# Patient Record
Sex: Male | Born: 1970 | Race: White | Hispanic: No | Smoking: Never smoker
Health system: Southern US, Community
[De-identification: ages and names within clinical notes are randomized; demographics above are authoritative.]

## PROBLEM LIST (undated history)

## (undated) DIAGNOSIS — M2550 Pain in unspecified joint: Secondary | ICD-10-CM

## (undated) DIAGNOSIS — E291 Testicular hypofunction: Secondary | ICD-10-CM

## (undated) DIAGNOSIS — N529 Male erectile dysfunction, unspecified: Secondary | ICD-10-CM

## (undated) DIAGNOSIS — E669 Obesity, unspecified: Secondary | ICD-10-CM

## (undated) DIAGNOSIS — R7989 Other specified abnormal findings of blood chemistry: Secondary | ICD-10-CM

## (undated) DIAGNOSIS — Z77098 Contact with and (suspected) exposure to other hazardous, chiefly nonmedicinal, chemicals: Secondary | ICD-10-CM

## (undated) DIAGNOSIS — Z973 Presence of spectacles and contact lenses: Secondary | ICD-10-CM

## (undated) DIAGNOSIS — Z77128 Contact with and (suspected) exposure to other hazards in the physical environment: Secondary | ICD-10-CM

## (undated) DIAGNOSIS — K219 Gastro-esophageal reflux disease without esophagitis: Secondary | ICD-10-CM

## (undated) HISTORY — DX: Contact with and (suspected) exposure to other hazards in the physical environment: Z77.128

## (undated) HISTORY — DX: Presence of spectacles and contact lenses: Z97.3

## (undated) HISTORY — DX: Male erectile dysfunction, unspecified: N52.9

## (undated) HISTORY — DX: Obesity, unspecified: E66.9

## (undated) HISTORY — DX: Pain in unspecified joint: M25.50

## (undated) HISTORY — DX: Testicular hypofunction: E29.1

## (undated) HISTORY — DX: Contact with and (suspected) exposure to other hazardous, chiefly nonmedicinal, chemicals: Z77.098

## (undated) HISTORY — PX: ESOPHAGOGASTRODUODENOSCOPY: SHX1529

## (undated) HISTORY — DX: Gastro-esophageal reflux disease without esophagitis: K21.9

## (undated) HISTORY — DX: Other specified abnormal findings of blood chemistry: R79.89

---

## 1991-11-19 HISTORY — PX: NASAL SEPTUM SURGERY: SHX37

## 2008-02-01 ENCOUNTER — Emergency Department (HOSPITAL_COMMUNITY): Admission: EM | Admit: 2008-02-01 | Discharge: 2008-02-01 | Payer: Self-pay | Admitting: Emergency Medicine

## 2008-08-07 ENCOUNTER — Emergency Department (HOSPITAL_COMMUNITY): Admission: EM | Admit: 2008-08-07 | Discharge: 2008-08-07 | Payer: Self-pay | Admitting: Emergency Medicine

## 2008-08-14 ENCOUNTER — Encounter: Admission: RE | Admit: 2008-08-14 | Discharge: 2008-08-14 | Payer: Self-pay | Admitting: Specialist

## 2010-12-12 ENCOUNTER — Emergency Department (HOSPITAL_COMMUNITY)
Admission: EM | Admit: 2010-12-12 | Discharge: 2010-12-12 | Payer: Self-pay | Source: Home / Self Care | Admitting: Emergency Medicine

## 2011-05-18 ENCOUNTER — Emergency Department (HOSPITAL_COMMUNITY)
Admission: EM | Admit: 2011-05-18 | Discharge: 2011-05-18 | Disposition: A | Discharge status: Home / Self Care | Payer: Self-pay | Attending: Emergency Medicine | Admitting: Emergency Medicine

## 2011-05-18 DIAGNOSIS — H538 Other visual disturbances: Secondary | ICD-10-CM | POA: Insufficient documentation

## 2011-05-18 DIAGNOSIS — R002 Palpitations: Secondary | ICD-10-CM | POA: Insufficient documentation

## 2011-05-18 DIAGNOSIS — M542 Cervicalgia: Secondary | ICD-10-CM | POA: Insufficient documentation

## 2011-05-21 ENCOUNTER — Ambulatory Visit (HOSPITAL_COMMUNITY)
Admission: RE | Admit: 2011-05-21 | Discharge: 2011-05-21 | Disposition: A | Discharge status: Home / Self Care | Payer: Self-pay | Source: Ambulatory Visit | Attending: Emergency Medicine | Admitting: Emergency Medicine

## 2011-05-21 DIAGNOSIS — R0989 Other specified symptoms and signs involving the circulatory and respiratory systems: Secondary | ICD-10-CM

## 2011-05-21 DIAGNOSIS — M542 Cervicalgia: Secondary | ICD-10-CM | POA: Insufficient documentation

## 2013-08-28 ENCOUNTER — Emergency Department (HOSPITAL_COMMUNITY)
Admission: EM | Admit: 2013-08-28 | Discharge: 2013-08-28 | Disposition: A | Discharge status: Home / Self Care | Payer: No Typology Code available for payment source | Source: Home / Self Care | Attending: Family Medicine | Admitting: Family Medicine

## 2013-08-28 ENCOUNTER — Encounter (HOSPITAL_COMMUNITY): Payer: Self-pay | Admitting: Emergency Medicine

## 2013-08-28 DIAGNOSIS — G243 Spasmodic torticollis: Secondary | ICD-10-CM

## 2013-08-28 MED ORDER — KETOROLAC TROMETHAMINE 30 MG/ML IJ SOLN
INTRAMUSCULAR | Status: AC
  Filled 2013-08-28: qty 1

## 2013-08-28 MED ORDER — KETOROLAC TROMETHAMINE 10 MG PO TABS: 10.0000 mg | ORAL_TABLET | Freq: Four times a day (QID) | ORAL | Status: DC | PRN

## 2013-08-28 MED ORDER — KETOROLAC TROMETHAMINE 30 MG/ML IJ SOLN: 30.0000 mg | Freq: Once | INTRAMUSCULAR | Status: DC

## 2013-08-28 NOTE — ED Notes (Signed)
Pt c/o neck pain on right side/shoulder onset 5 days  Denies: Inj/trauma, strenuous activity Saw PCP yest and was given flexeril 5 mg w/no relief Pain is constant and increases w/activity... Unable to raise right arm above head Alert w/no signs of acute distress.

## 2013-08-28 NOTE — ED Provider Notes (Signed)
CSN: [id number]     Arrival date & time 08/28/13  1555 History   First MD Initiated Contact with Patient 08/28/13 1629     Chief Complaint  Patient presents with  . Neck Pain   (Consider location/radiation/quality/duration/timing/severity/associated sxs/prior Treatment) Patient is a 42 y.o. male presenting with neck pain. The history is provided by the patient.  Neck Pain Pain location:  R side Quality:  Cramping Pain radiates to:  Does not radiate Pain severity:  Moderate Onset quality:  Unable to specify Duration:  5 days Timing:  Constant Progression:  Unchanged Chronicity:  New (seen by pcp yest, given bid flexeril , without relief.) Context comment:  NKI Associated symptoms: no bladder incontinence, no bowel incontinence, no numbness, no paresis, no tingling and no weakness   Risk factors: no recent head injury and no recurrent falls     History reviewed. No pertinent past medical history. Past Surgical History  Procedure Laterality Date  . Nose surgery     No family history on file. History  Substance Use Topics  . Smoking status: Never Smoker   . Smokeless tobacco: Not on file  . Alcohol Use: Yes    Review of Systems  Gastrointestinal: Negative for bowel incontinence.  Genitourinary: Negative for bladder incontinence.  Musculoskeletal: Positive for neck pain.  Neurological: Negative for tingling, weakness and numbness.    Allergies  Review of patient's allergies indicates no known allergies.  Home Medications   Current Outpatient Rx  Name  Route  Sig  Dispense  Refill  . cyclobenzaprine (FLEXERIL) 5 MG tablet   Oral   Take 5 mg by mouth 3 (three) times daily as needed for muscle spasms.         Marland Kitchen ketorolac (TORADOL) 10 MG tablet   Oral   Take 1 tablet (10 mg total) by mouth every 6 (six) hours as needed for pain.   20 tablet   0    BP 127/85  Pulse 78  Temp(Src) 97.1 F (36.2 C) (Oral)  Resp 18  SpO2 96% Physical Exam  Nursing note and  vitals reviewed. Constitutional: He is oriented to person, place, and time. He appears well-developed and well-nourished. No distress.  HENT:  Head: Normocephalic.  Right Ear: External ear normal.  Left Ear: External ear normal.  Mouth/Throat: Oropharynx is clear and moist.  Neck: Trachea normal. Muscular tenderness present. No spinous process tenderness present. No rigidity. Decreased range of motion present. No Brudzinski's sign and no Kernig's sign noted.  Cardiovascular: Regular rhythm.   Pulmonary/Chest: Effort normal and breath sounds normal.  Musculoskeletal: He exhibits tenderness.  Lymphadenopathy:    He has no cervical adenopathy.  Neurological: He is alert and oriented to person, place, and time. No cranial nerve deficit.  Skin: Skin is warm and dry.    ED Course  Procedures (including critical care time) Labs Review Labs Reviewed - No data to display Imaging Review No results found.    MDM      Linna Hoff, MD 08/28/13 [phone]

## 2015-01-19 ENCOUNTER — Ambulatory Visit (INDEPENDENT_AMBULATORY_CARE_PROVIDER_SITE_OTHER): Payer: BLUE CROSS/BLUE SHIELD | Admitting: Medical

## 2015-01-19 ENCOUNTER — Encounter: Payer: Self-pay | Admitting: Medical

## 2015-01-19 ENCOUNTER — Ambulatory Visit
Admission: RE | Admit: 2015-01-19 | Discharge: 2015-01-19 | Disposition: A | Discharge status: Home / Self Care | Payer: BLUE CROSS/BLUE SHIELD | Source: Ambulatory Visit | Attending: Medical | Admitting: Medical

## 2015-01-19 VITALS — BP 130/84 | HR 80 | Temp 98.0°F | Resp 16 | Ht >= 80 in | Wt 326.0 lb

## 2015-01-19 DIAGNOSIS — E669 Obesity, unspecified: Secondary | ICD-10-CM

## 2015-01-19 DIAGNOSIS — R0789 Other chest pain: Secondary | ICD-10-CM

## 2015-01-19 DIAGNOSIS — R059 Cough, unspecified: Secondary | ICD-10-CM

## 2015-01-19 DIAGNOSIS — Z8719 Personal history of other diseases of the digestive system: Secondary | ICD-10-CM

## 2015-01-19 DIAGNOSIS — R5383 Other fatigue: Secondary | ICD-10-CM

## 2015-01-19 DIAGNOSIS — R0600 Dyspnea, unspecified: Secondary | ICD-10-CM | POA: Diagnosis not present

## 2015-01-19 DIAGNOSIS — Z9189 Other specified personal risk factors, not elsewhere classified: Secondary | ICD-10-CM | POA: Diagnosis not present

## 2015-01-19 DIAGNOSIS — Z77128 Contact with and (suspected) exposure to other hazards in the physical environment: Secondary | ICD-10-CM

## 2015-01-19 DIAGNOSIS — R05 Cough: Secondary | ICD-10-CM

## 2015-01-19 LAB — TSH: TSH: 1.689 u[IU]/mL (ref 0.350–4.500)

## 2015-01-19 LAB — CBC WITH DIFFERENTIAL/PLATELET
Basophils Absolute: 0 10*3/uL (ref 0.0–0.1)
Basophils Relative: 0 % (ref 0–1)
Eosinophils Absolute: 0.1 10*3/uL (ref 0.0–0.7)
Eosinophils Relative: 1 % (ref 0–5)
HCT: 43.6 % (ref 39.0–52.0)
Hemoglobin: 14.9 g/dL (ref 13.0–17.0)
LYMPHS ABS: 1.8 10*3/uL (ref 0.7–4.0)
LYMPHS PCT: 26 % (ref 12–46)
MCH: 28.3 pg (ref 26.0–34.0)
MCHC: 34.2 g/dL (ref 30.0–36.0)
MCV: 82.7 fL (ref 78.0–100.0)
MONOS PCT: 10 % (ref 3–12)
MPV: 9.9 fL (ref 8.6–12.4)
Monocytes Absolute: 0.7 10*3/uL (ref 0.1–1.0)
NEUTROS PCT: 63 % (ref 43–77)
Neutro Abs: 4.3 10*3/uL (ref 1.7–7.7)
PLATELETS: 261 10*3/uL (ref 150–400)
RBC: 5.27 MIL/uL (ref 4.22–5.81)
RDW: 14.4 % (ref 11.5–15.5)
WBC: 6.9 10*3/uL (ref 4.0–10.5)

## 2015-01-19 LAB — COMPREHENSIVE METABOLIC PANEL
ALBUMIN: 4.2 g/dL (ref 3.5–5.2)
ALK PHOS: 56 U/L (ref 39–117)
ALT: 45 U/L (ref 0–53)
AST: 25 U/L (ref 0–37)
BILIRUBIN TOTAL: 0.6 mg/dL (ref 0.2–1.2)
BUN: 20 mg/dL (ref 6–23)
CO2: 28 mEq/L (ref 19–32)
Calcium: 9.7 mg/dL (ref 8.4–10.5)
Chloride: 102 mEq/L (ref 96–112)
Creat: 0.97 mg/dL (ref 0.50–1.35)
Glucose, Bld: 99 mg/dL (ref 70–99)
POTASSIUM: 3.9 meq/L (ref 3.5–5.3)
SODIUM: 140 meq/L (ref 135–145)
Total Protein: 6.8 g/dL (ref 6.0–8.3)

## 2015-01-19 MED ORDER — MUPIROCIN 2 % EX OINT: 1.0000 "application " | TOPICAL_OINTMENT | Freq: Three times a day (TID) | CUTANEOUS | Status: DC
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
## 2015-01-23 ENCOUNTER — Telehealth: Payer: Self-pay | Admitting: Family Medicine

## 2015-01-23 NOTE — Telephone Encounter (Signed)
Patient called and said that the mupirocin was burning the inside of his nose. He thought that he was using it wrong because the box says external use. Can you prescribe him something else to use? Please, advie

## 2015-01-23 NOTE — Telephone Encounter (Signed)
I apologize if the label was wrong.  This was for the skin lesion on the left side of his face, not the nose.

## 2015-01-23 NOTE — Telephone Encounter (Signed)
Patient is aware of Alex CoveyShane Tysinger PA message about the medication

## 2015-02-01 ENCOUNTER — Other Ambulatory Visit: Payer: Self-pay | Admitting: Medical

## 2015-02-01 ENCOUNTER — Encounter: Payer: Self-pay | Admitting: Medical

## 2015-02-01 ENCOUNTER — Ambulatory Visit (INDEPENDENT_AMBULATORY_CARE_PROVIDER_SITE_OTHER): Payer: BLUE CROSS/BLUE SHIELD | Admitting: Medical

## 2015-02-01 VITALS — BP 100/68 | HR 82 | Temp 98.6°F | Resp 15 | Wt 336.0 lb

## 2015-02-01 DIAGNOSIS — R0600 Dyspnea, unspecified: Secondary | ICD-10-CM

## 2015-02-01 DIAGNOSIS — N528 Other male erectile dysfunction: Secondary | ICD-10-CM

## 2015-02-01 DIAGNOSIS — E669 Obesity, unspecified: Secondary | ICD-10-CM | POA: Diagnosis not present

## 2015-02-01 LAB — LIPID PANEL
CHOL/HDL RATIO: 4.7 ratio
CHOLESTEROL: 147 mg/dL (ref 0–200)
HDL: 31 mg/dL — ABNORMAL LOW (ref >=40)
LDL Cholesterol: 89 mg/dL (ref 0–99)
TRIGLYCERIDES: 137 mg/dL (ref <150)
VLDL: 27 mg/dL (ref 0–40)

## 2015-02-01 MED ORDER — MOMETASONE FUROATE 220 MCG/INH IN AEPB: 1.0000 | INHALATION_SPRAY | Freq: Two times a day (BID) | RESPIRATORY_TRACT | Status: DC

## 2015-02-01 MED ORDER — ALBUTEROL SULFATE HFA 108 (90 BASE) MCG/ACT IN AERS: 2.0000 | INHALATION_SPRAY | Freq: Four times a day (QID) | RESPIRATORY_TRACT | Status: DC | PRN

## 2015-02-01 MED ORDER — AVANAFIL 100 MG PO TABS: 1.0000 | ORAL_TABLET | ORAL | Status: DC | PRN
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
## 2015-02-02 ENCOUNTER — Other Ambulatory Visit: Payer: Self-pay | Admitting: Medical

## 2015-02-02 LAB — TESTOSTERONE: TESTOSTERONE: 221 ng/dL — AB (ref 300–890)

## 2015-02-02 MED ORDER — ALBUTEROL SULFATE 108 (90 BASE) MCG/ACT IN AEPB: 1.0000 | INHALATION_SPRAY | Freq: Four times a day (QID) | RESPIRATORY_TRACT | Status: DC | PRN

## 2015-02-16 ENCOUNTER — Encounter: Payer: Self-pay | Admitting: Medical

## 2015-02-16 ENCOUNTER — Ambulatory Visit (INDEPENDENT_AMBULATORY_CARE_PROVIDER_SITE_OTHER): Payer: BLUE CROSS/BLUE SHIELD | Admitting: Medical

## 2015-02-16 VITALS — BP 118/74 | HR 78 | Temp 98.0°F | Resp 15 | Wt 339.0 lb

## 2015-02-16 DIAGNOSIS — J4530 Mild persistent asthma, uncomplicated: Secondary | ICD-10-CM | POA: Diagnosis not present

## 2015-02-16 DIAGNOSIS — E291 Testicular hypofunction: Secondary | ICD-10-CM | POA: Diagnosis not present

## 2015-02-16 DIAGNOSIS — R5383 Other fatigue: Secondary | ICD-10-CM

## 2015-02-16 DIAGNOSIS — R7989 Other specified abnormal findings of blood chemistry: Secondary | ICD-10-CM

## 2015-02-16 DIAGNOSIS — N529 Male erectile dysfunction, unspecified: Secondary | ICD-10-CM | POA: Diagnosis not present

## 2015-02-16 DIAGNOSIS — R062 Wheezing: Secondary | ICD-10-CM

## 2015-02-16 LAB — FSH/LH
FSH: 5.9 m[IU]/mL (ref 1.4–18.1)
LH: 3.8 m[IU]/mL (ref 1.5–9.3)

## 2015-02-16 LAB — PROLACTIN: Prolactin: 8.8 ng/mL (ref 2.1–17.1)

## 2015-02-16 LAB — TESTOSTERONE: Testosterone: 210 ng/dL — ABNORMAL LOW (ref 300–890)

## 2015-02-16 NOTE — Progress Notes (Signed)
Subjective: Here for follow-up. Since last visit started asmanex with good improvement in dyspnea and wheezing.  Hasn't had to use albuterol  Hasn't tried stendra yet for ED.  Has used friend's daily cialis in the past with good benefit  Still notes fatigue, erectile dysfunction, low sex drive.  Here for recheck on low TST from last visit  No other aggravating or relieving factors. No other complaint.  ROS as in subjective   Objective: BP 118/74 mmHg  Pulse 78  Temp(Src) 98 F (36.7 C) (Oral)  Resp 15  Wt 339 lb (153.769 kg)  General appearance: alert, no distress, WD/WN, obese tall white male Heart: RRR, normal S1, S2, no murmurs Lungs: CTA bilaterally, no wheezes, rhonchi, or rales DRE: prostate seems WNL, no obvious nodules   Assessment: Encounter Diagnoses  Name Primary?  Marland Kitchen. Asthma, mild persistent, uncomplicated Yes  . Wheezing   . Low testosterone   . Other fatigue   . Erectile dysfunction, unspecified erectile dysfunction type      Plan: Asthma, wheezing - reviewed spirometry from last visit.   Much improved on Asmanex once daily.  Hasn't had to use albuterol since last visit.  C/t current medication.  Low testosterone - additional labs today for confirmation and further eval. discussed possibly beginning androgel therapy.  Discussed medication use, risks/benefits, precautions and f/u.   Advised weight loss through health lifestyle.  fatigue - additional labs today  ED - hasn't tried stendra yet

## 2015-02-17 ENCOUNTER — Telehealth: Payer: Self-pay | Admitting: Medical

## 2015-02-17 ENCOUNTER — Other Ambulatory Visit: Payer: Self-pay | Admitting: Medical

## 2015-02-17 ENCOUNTER — Other Ambulatory Visit: Payer: Self-pay | Admitting: Family Medicine

## 2015-02-17 DIAGNOSIS — R7989 Other specified abnormal findings of blood chemistry: Secondary | ICD-10-CM

## 2015-02-17 LAB — PSA: PSA: 0.62 ng/mL (ref <=4.00)

## 2015-02-17 MED ORDER — TESTOSTERONE 20.25 MG/1.25GM (1.62%) TD GEL: 2.0000 | TRANSDERMAL | Status: DC

## 2015-02-17 NOTE — Telephone Encounter (Signed)
Initiated P.A. Androgel 

## 2015-02-21 NOTE — Telephone Encounter (Signed)
P.A. Approved Androgel til 11/17/38, pt informed, faxed pharmacy

## 2015-03-20 ENCOUNTER — Other Ambulatory Visit: Payer: BLUE CROSS/BLUE SHIELD

## 2015-03-20 DIAGNOSIS — R7989 Other specified abnormal findings of blood chemistry: Secondary | ICD-10-CM

## 2015-03-21 ENCOUNTER — Other Ambulatory Visit: Payer: Self-pay | Admitting: Family Medicine

## 2015-03-21 LAB — TESTOSTERONE: TESTOSTERONE: 237 ng/dL — AB (ref 300–890)

## 2015-03-21 MED ORDER — TESTOSTERONE 20.25 MG/1.25GM (1.62%) TD GEL: 4.0000 | TRANSDERMAL | Status: DC

## 2015-03-22 ENCOUNTER — Other Ambulatory Visit: Payer: Self-pay

## 2015-04-12 ENCOUNTER — Emergency Department (HOSPITAL_COMMUNITY)
Admission: EM | Admit: 2015-04-12 | Discharge: 2015-04-12 | Disposition: A | Discharge status: Home / Self Care | Payer: Self-pay

## 2015-04-13 ENCOUNTER — Encounter (HOSPITAL_COMMUNITY): Payer: Self-pay | Admitting: Emergency Medicine

## 2015-04-13 ENCOUNTER — Emergency Department (HOSPITAL_COMMUNITY)
Admission: EM | Admit: 2015-04-13 | Discharge: 2015-04-13 | Disposition: A | Discharge status: Home / Self Care | Payer: BLUE CROSS/BLUE SHIELD | Source: Home / Self Care | Attending: Family Medicine | Admitting: Family Medicine

## 2015-04-13 ENCOUNTER — Emergency Department (INDEPENDENT_AMBULATORY_CARE_PROVIDER_SITE_OTHER): Payer: BLUE CROSS/BLUE SHIELD

## 2015-04-13 DIAGNOSIS — J4531 Mild persistent asthma with (acute) exacerbation: Secondary | ICD-10-CM

## 2015-04-13 MED ORDER — IPRATROPIUM-ALBUTEROL 0.5-2.5 (3) MG/3ML IN SOLN
RESPIRATORY_TRACT | Status: AC
  Filled 2015-04-13: qty 3

## 2015-04-13 MED ORDER — IPRATROPIUM-ALBUTEROL 0.5-2.5 (3) MG/3ML IN SOLN
3.0000 mL | Freq: Once | RESPIRATORY_TRACT | Status: AC
  Administered 2015-04-13: 3 mL via RESPIRATORY_TRACT

## 2015-04-13 NOTE — Discharge Instructions (Signed)
Thank you for coming in today. Use the albuterol inhaler as needed Continue the Asmanex twist inhaler Follow-up with your primary care doctor Call or go to the emergency room if you get worse, have trouble breathing, have chest pains, or palpitations.    Asthma, Acute Bronchospasm Acute bronchospasm caused by asthma is also referred to as an asthma attack. Bronchospasm means your air passages become narrowed. The narrowing is caused by inflammation and tightening of the muscles in the air tubes (bronchi) in your lungs. This can make it hard to breathe or cause you to wheeze and cough. CAUSES Possible triggers are:  Animal dander from the skin, hair, or feathers of animals.  Dust mites contained in house dust.  Cockroaches.  Pollen from trees or grass.  Mold.  Cigarette or tobacco smoke.  Air pollutants such as dust, household cleaners, hair sprays, aerosol sprays, paint fumes, strong chemicals, or strong odors.  Cold air or weather changes. Cold air may trigger inflammation. Winds increase molds and pollens in the air.  Strong emotions such as crying or laughing hard.  Stress.  Certain medicines such as aspirin or beta-blockers.  Sulfites in foods and drinks, such as dried fruits and wine.  Infections or inflammatory conditions, such as a flu, cold, or inflammation of the nasal membranes (rhinitis).  Gastroesophageal reflux disease (GERD). GERD is a condition where stomach acid backs up into your esophagus.  Exercise or strenuous activity. SIGNS AND SYMPTOMS   Wheezing.  Excessive coughing, particularly at night.  Chest tightness.  Shortness of breath. DIAGNOSIS  Your health care provider will ask you about your medical history and perform a physical exam. A chest X-ray or blood testing may be performed to look for other causes of your symptoms or other conditions that may have triggered your asthma attack. TREATMENT  Treatment is aimed at reducing inflammation  and opening up the airways in your lungs. Most asthma attacks are treated with inhaled medicines. These include quick relief or rescue medicines (such as bronchodilators) and controller medicines (such as inhaled corticosteroids). These medicines are sometimes given through an inhaler or a nebulizer. Systemic steroid medicine taken by mouth or given through an IV tube also can be used to reduce the inflammation when an attack is moderate or severe. Antibiotic medicines are only used if a bacterial infection is present.  HOME CARE INSTRUCTIONS   Rest.  Drink plenty of liquids. This helps the mucus to remain thin and be easily coughed up. Only use caffeine in moderation and do not use alcohol until you have recovered from your illness.  Do not smoke. Avoid being exposed to secondhand smoke.  You play a critical role in keeping yourself in good health. Avoid exposure to things that cause you to wheeze or to have breathing problems.  Keep your medicines up-to-date and available. Carefully follow your health care provider's treatment plan.  Take your medicine exactly as prescribed.  When pollen or pollution is bad, keep windows closed and use an air conditioner or go to places with air conditioning.  Asthma requires careful medical care. See your health care provider for a follow-up as advised. If you are more than [redacted] weeks pregnant and you were prescribed any new medicines, let your obstetrician know about the visit and how you are doing. Follow up with your health care provider as directed.  After you have recovered from your asthma attack, make an appointment with your outpatient doctor to talk about ways to reduce the likelihood of future  attacks. If you do not have a doctor who manages your asthma, make an appointment with a primary care doctor to discuss your asthma. SEEK IMMEDIATE MEDICAL CARE IF:   You are getting worse.  You have trouble breathing. If severe, call your local emergency  services (911 in the [place]  You develop chest pain or discomfort.  You are vomiting.  You are not able to keep fluids down.  You are coughing up yellow, green, brown, or bloody sputum.  You have a fever and your symptoms suddenly get worse.  You have trouble swallowing. MAKE SURE YOU:   Understand these instructions.  Will watch your condition.  Will get help right away if you are not doing well or get worse. Document Released: 02/19/2007 Document Revised: 11/09/2013 Document Reviewed: 05/12/2013 [facility] Patient Information 2015 [place], [place]. This information is not intended to replace advice given to you by your health care provider. Make sure you discuss any questions you have with your health care provider.

## 2015-04-13 NOTE — ED Provider Notes (Signed)
Alex Atkins is a 44 y.o. male who presents to Urgent Care today for wheezing and cough present for the last few months worsening recently. In the past patient has used Asmanex inhaler which seems to help temporarily while using. No fevers or chills vomiting or diarrhea. 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 No fevers or chills nausea vomiting or diarrhea. No leg swelling.  Patient has but has not been using an albuterol inhaler.   Past Medical History  Diagnosis Date  . Wears glasses   . Obesity   . Joint pain   . 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 GERD (gastroesophageal reflux disease)    Past Surgical History  Procedure Laterality Date  . Nose surgery  1993   History  Substance Use Topics  . Smoking status: Never Smoker   . Smokeless tobacco: Not on file  . Alcohol Use: 1.2 oz/week    1 Glasses of wine, 1 Shots of liquor per week   ROS as above Medications: No current facility-administered medications for this encounter.   Current Outpatient Prescriptions  Medication Sig Dispense Refill  . Albuterol Sulfate (PROAIR RESPICLICK) 108 (90 BASE) MCG/ACT AEPB Inhale 1 Inhaler into the lungs every 6 (six) hours as needed. 1 each 0  . Avanafil (STENDRA) 100 MG TABS Take 1 tablet by mouth as needed. 3 tablet 0  . Fish Oil-Krill Oil (KRILL OIL PLUS PO) Take by mouth.    . Flaxseed, Linseed, (FLAX SEED OIL PO) Take by mouth.    . Misc Natural Products (OSTEO BI-FLEX ADV DOUBLE ST) CAPS Take by mouth.    . mometasone (ASMANEX) 220 MCG/INH inhaler Inhale 1 puff into the lungs 2 (two) times daily. 1 Inhaler 2  . mupirocin ointment (BACTROBAN) 2 % Place 1 application into the nose 3 (three) times daily. 22 g 0  . PROAIR RESPICLICK 108 (90 BASE) MCG/ACT AEPB use as directed 1 each 0  . Testosterone (ANDROGEL) 20.25  MG/1.25GM (1.62%) GEL Place 4 Squirts onto the skin every morning. 1.25 g 1   No Known Allergies   Exam:  BP 131/83 mmHg  Pulse 88  Temp(Src) 97.7 F (36.5 C) (Oral)  Resp 20  SpO2 95% Gen: Well NAD HEENT: EOMI,  MMM Lungs: Normal work of breathing. Coarse breath sounds bilaterally Heart: RRR no MRG Abd: NABS, Soft. Nondistended, Nontender Exts: Brisk capillary refill, warm and well perfused.   Patient was given a 2.5/0.5 mg DuoNeb nebulizer treatment and felt a little better.  No results found for this or any previous visit (from the past 24 hour(s)). Dg Chest 2 View  04/13/2015   CLINICAL DATA:  Cough and wheezing. Shortness of breath for the past 2 months.  EXAM: CHEST  2 VIEW  COMPARISON:  01/19/2015.  FINDINGS: Normal sized heart. Clear lungs. Minimal thoracic spine degenerative changes.  IMPRESSION: No acute abnormality.   Electronically Signed   By: Beckie SaltsSteven  Reid M.D.   On: 04/13/2015 18:12    Assessment and Plan: 44 y.o. male with bronchitis versus asthma exacerbation. Patient declined prednisone. Stating he will use his Asmanex inhaler and albuterol. Follow-up with PCP.  Discussed warning signs or symptoms. Please see discharge instructions. Patient expresses understanding.     Rodolph BongEvan S Corey, MD 04/13/15 [phone]

## 2015-04-13 NOTE — ED Notes (Signed)
C/o  Persistent nonproductive cough for several weeks.  Sob with laying flat.  Wheezing.  Symptoms gradually getting worse.  No relief with inhalers.

## 2015-04-27 ENCOUNTER — Encounter: Payer: Self-pay | Admitting: Family Medicine

## 2015-04-27 ENCOUNTER — Ambulatory Visit (INDEPENDENT_AMBULATORY_CARE_PROVIDER_SITE_OTHER): Payer: BLUE CROSS/BLUE SHIELD | Admitting: Family Medicine

## 2015-04-27 ENCOUNTER — Ambulatory Visit: Payer: BLUE CROSS/BLUE SHIELD | Admitting: Medical

## 2015-04-27 VITALS — BP 118/78 | HR 76 | Wt 333.4 lb

## 2015-04-27 DIAGNOSIS — J45909 Unspecified asthma, uncomplicated: Secondary | ICD-10-CM | POA: Diagnosis not present

## 2015-04-27 DIAGNOSIS — H5711 Ocular pain, right eye: Secondary | ICD-10-CM

## 2015-04-27 DIAGNOSIS — J309 Allergic rhinitis, unspecified: Secondary | ICD-10-CM | POA: Diagnosis not present

## 2015-04-27 NOTE — Progress Notes (Signed)
Chief Complaint  Patient presents with  . inhaler issues    having side effects from asmanex. having pressure in eye. draining alot on the side..sometimes painful   Patient presents with complaint of right eye pain, which he feels is related to use of Asmanex.  He finds that the asmanex helps with his wheezing.  Wheezing recurs if doesn't use it for a couple of weeks, then seems to work well using it just prn.  He uses it every 3-5 days, just when coughing/wheezing.   Every time he takes it, he feels like there is pressure behind the right eye, more than the left.  Pressure is greatest in the morning, but continues periodically throughout the day. Eye symptoms lasts for a couple of days. The following morning he will notice a clear drainage when he presses below and above the eye. He denies crusting, itching, h/o eye trauma.  He has discomfort along the eye when he presses on it.  Last eye exam was 6 months ago. He wasn't on the asmanex at that time, and glaucoma test was normal.  UC last week with cough, improved after Duoneb treatment.  He was not prescribed any antibiotics, just told to continue inhalers. The asmanex inhaler helped his symptoms of wheezing at the time.  He denies any sniffles, sneezes, postnasal drainage, sinus pressure elsewhere. No fevers, chills.  Eyelids feel heavy, like they are sagging.  PMH, PSH, SH reviewed.  Outpatient Encounter Prescriptions as of 04/27/2015  Medication Sig Note  . Avanafil (STENDRA) 100 MG TABS Take 1 tablet by mouth as needed.   . Fish Oil-Krill Oil (KRILL OIL PLUS PO) Take by mouth.   . Misc Natural Products (OSTEO BI-FLEX ADV DOUBLE ST) CAPS Take by mouth.   . mometasone (ASMANEX) 220 MCG/INH inhaler Inhale 1 puff into the lungs 2 (two) times daily.   . Testosterone (ANDROGEL) 20.25 MG/1.25GM (1.62%) GEL Place 4 Squirts onto the skin every morning.   . Albuterol Sulfate (PROAIR RESPICLICK) 108 (90 BASE) MCG/ACT AEPB Inhale 1 Inhaler into the  lungs every 6 (six) hours as needed. (Patient not taking: Reported on 04/27/2015) 04/27/2015: Has never used this yet  . Flaxseed, Linseed, (FLAX SEED OIL PO) Take by mouth.   . [DISCONTINUED] mupirocin ointment (BACTROBAN) 2 % Place 1 application into the nose 3 (three) times daily. (Patient not taking: Reported on 04/27/2015)   . [DISCONTINUED] PROAIR RESPICLICK 108 (90 BASE) MCG/ACT AEPB use as directed    No facility-administered encounter medications on file as of 04/27/2015.   No Known Allergies  ROS: no fever, chills, runny nose, sore throat.  Cough is improved.  Currently without chest pain or shortness of breath.  No nausea, vomiting, diarrhea.  He has a h/o reflux that was much worse when he gained weight. He lost the weight, and no longer has any reflux problems.  Denies vision changes, just eye pressure and pain.  PHYSICAL EXAM: BP 118/78 mmHg  Pulse 76  Wt 333 lb 6.4 oz (151.229 kg) Well developed male, in no distress HEENT: PERRL, EOMI, conjunctiva--only minimally injected in a focal area laterally on the right, otherwise clear.  Fundi was normal without hemorrhage or exudate, but view was limited (miotic pupil, reflection of light). There appears to be slight asymmetry between the eyes, a little generalized swelling on the right (making the opening of the eye appear slightly smaller).  No focal swelling or mass on upper or lower lids, or surrounding the eye.  No erythema. Nasal  mucosa is mild- mod edematous, with clear-white mucus on the left Sinuses are nontender. OP is clear. TM's and EAC's are normal Neck: no lymphadenopathy or mass Heart: regular rate and rhythm Lungs: clear bilaterally. No wheezing. Good air movement. Skin: no visible rash Neuro: alert and oriented.  Cranial nerves intact. Normal gait, strength Psych: normal mood, affect, hygiene and grooming   ASSESSMENT/PLAN:  Eye pain, right - Ddx reviewed with patient. possible sinus/allergy component. f/u with ophtho for  pressure check  Asthma without acute exacerbation - using steroid inhaler inappropriately.  stop asmanex; use albuterol prn.  f/u with Vincenza Hews within 2 weeks  Allergic rhinitis, unspecified allergic rhinitis type - evidence on nasal exam; may have mild allergic conjunctivitis and sinus pressure contributing to ocular symptoms. claritin daily.    Start claritin (loratidine) once daily, as there is evidence of some allergies on your exam. Stop the asmanex.  Use the albuterol as needed for wheezing--this is actually for fast-acting, as needed medication.  The asmanex is a daily preventative medication, but since you aren't able to tolerate taking it that way, stop it for now; use the albuterol if needed for wheezing.  See Vincenza Hews in follow up within the next couple of weeks. Keep track of how often you're needing the albuterol. You may need to be restarted on another type of preventative medication at that visit. Schedule a visit with the eye doctor to rule out glaucoma or other cause of eye pain.  You can also try taking sudafed as needed for the pain, in case it is a sinus-type pain. If you have recurrent cough related possibly to allergies and postnasal drainage (or a cold), use Mucinex (guaifenesin) as needed.

## 2015-04-27 NOTE — Patient Instructions (Signed)
   Start claritin (loratidine) once daily, as there is evidence of some allergies on your exam. Stop the asmanex.  Use the albuterol as needed for wheezing--this is actually for fast-acting, as needed medication.  The asmanex is a daily preventative medication, but since you aren't able to tolerate taking it that way, stop it for now; use the albuterol if needed for wheezing.  See Vincenza Hews in follow up within the next couple of weeks. Keep track of how often you're needing the albuterol. You may need to be restarted on another type of preventative medication at that visit. Schedule a visit with the eye doctor to rule out glaucoma or other cause of eye pain.  You can also try taking sudafed as needed for the pain, in case it is a sinus-type pain. If you have recurrent cough related possibly to allergies and postnasal drainage (or a cold), use Mucinex (guaifenesin) as needed.

## 2015-05-03 ENCOUNTER — Other Ambulatory Visit: Payer: BLUE CROSS/BLUE SHIELD

## 2015-05-03 DIAGNOSIS — E291 Testicular hypofunction: Secondary | ICD-10-CM

## 2015-05-04 LAB — TESTOSTERONE: Testosterone: 234 ng/dL — ABNORMAL LOW (ref 300–890)

## 2015-05-05 ENCOUNTER — Other Ambulatory Visit: Payer: Self-pay | Admitting: Medical

## 2015-05-05 MED ORDER — TESTOSTERONE CYPIONATE 200 MG/ML IM SOLN: 200.0000 mg | INTRAMUSCULAR | Status: DC

## 2015-05-09 ENCOUNTER — Telehealth: Payer: Self-pay | Admitting: Medical

## 2015-05-09 NOTE — Telephone Encounter (Signed)
Pt states was on Androgel & wasn't working so was switched to Testosterone injections.  Advised will initiate P.A.

## 2015-05-10 NOTE — Telephone Encounter (Signed)
Initiated P.A. Testosterone to [facility]

## 2015-05-12 NOTE — Telephone Encounter (Signed)
P.A. Testosterone CYP approved til 11/17/38, faxed pharmacy, left message for pt

## 2015-05-15 ENCOUNTER — Other Ambulatory Visit (INDEPENDENT_AMBULATORY_CARE_PROVIDER_SITE_OTHER): Payer: BLUE CROSS/BLUE SHIELD

## 2015-05-15 DIAGNOSIS — E291 Testicular hypofunction: Secondary | ICD-10-CM

## 2015-05-15 DIAGNOSIS — R7989 Other specified abnormal findings of blood chemistry: Secondary | ICD-10-CM

## 2015-05-15 MED ORDER — TESTOSTERONE CYPIONATE 200 MG/ML IM SOLN
200.0000 mg | Freq: Once | INTRAMUSCULAR | Status: AC
  Administered 2015-05-15: 200 mg via INTRAMUSCULAR

## 2015-05-15 MED ORDER — TESTOSTERONE CYPIONATE 200 MG/ML IM SOLN: 200.0000 mg | INTRAMUSCULAR | Status: DC

## 2015-05-26 ENCOUNTER — Other Ambulatory Visit (INDEPENDENT_AMBULATORY_CARE_PROVIDER_SITE_OTHER): Payer: BLUE CROSS/BLUE SHIELD

## 2015-05-26 DIAGNOSIS — E291 Testicular hypofunction: Secondary | ICD-10-CM

## 2015-05-26 MED ORDER — TESTOSTERONE CYPIONATE 200 MG/ML IM SOLN
200.0000 mg | Freq: Once | INTRAMUSCULAR | Status: AC
  Administered 2015-05-26: 200 mg via INTRAMUSCULAR

## 2015-05-26 MED ORDER — TESTOSTERONE CYPIONATE 200 MG/ML IM SOLN: 200.0000 mg | INTRAMUSCULAR | Status: DC

## 2015-05-29 ENCOUNTER — Other Ambulatory Visit: Payer: BLUE CROSS/BLUE SHIELD

## 2015-06-09 ENCOUNTER — Other Ambulatory Visit (INDEPENDENT_AMBULATORY_CARE_PROVIDER_SITE_OTHER): Payer: BLUE CROSS/BLUE SHIELD

## 2015-06-09 DIAGNOSIS — R7989 Other specified abnormal findings of blood chemistry: Secondary | ICD-10-CM

## 2015-06-09 DIAGNOSIS — E291 Testicular hypofunction: Secondary | ICD-10-CM | POA: Diagnosis not present

## 2015-06-09 MED ORDER — TESTOSTERONE CYPIONATE 200 MG/ML IM SOLN
200.0000 mg | INTRAMUSCULAR | Status: DC
  Administered 2015-06-09: 200 mg via INTRAMUSCULAR

## 2015-06-22 ENCOUNTER — Other Ambulatory Visit (INDEPENDENT_AMBULATORY_CARE_PROVIDER_SITE_OTHER): Payer: BLUE CROSS/BLUE SHIELD

## 2015-06-22 ENCOUNTER — Telehealth: Payer: Self-pay | Admitting: Medical

## 2015-06-22 DIAGNOSIS — E291 Testicular hypofunction: Secondary | ICD-10-CM | POA: Diagnosis not present

## 2015-06-22 NOTE — Telephone Encounter (Signed)
Just scheduled visit and labs, as he is due.

## 2015-06-22 NOTE — Telephone Encounter (Signed)
Pt would like to have his testosterone level and also a full blood panel to check cholesterol, sugar, etc.  Is this ok? if so please put in order

## 2015-06-26 ENCOUNTER — Other Ambulatory Visit: Payer: BLUE CROSS/BLUE SHIELD

## 2015-06-27 ENCOUNTER — Other Ambulatory Visit: Payer: Self-pay | Admitting: Medical

## 2015-06-27 ENCOUNTER — Ambulatory Visit (INDEPENDENT_AMBULATORY_CARE_PROVIDER_SITE_OTHER): Payer: BLUE CROSS/BLUE SHIELD | Admitting: Medical

## 2015-06-27 ENCOUNTER — Encounter: Payer: Self-pay | Admitting: Medical

## 2015-06-27 VITALS — BP 120/86 | HR 76 | Temp 98.2°F | Resp 16 | Ht 79.0 in | Wt 333.2 lb

## 2015-06-27 DIAGNOSIS — R1011 Right upper quadrant pain: Secondary | ICD-10-CM | POA: Diagnosis not present

## 2015-06-27 DIAGNOSIS — E291 Testicular hypofunction: Secondary | ICD-10-CM

## 2015-06-27 DIAGNOSIS — N528 Other male erectile dysfunction: Secondary | ICD-10-CM | POA: Diagnosis not present

## 2015-06-27 DIAGNOSIS — R7989 Other specified abnormal findings of blood chemistry: Secondary | ICD-10-CM

## 2015-06-27 LAB — CBC
HEMATOCRIT: 48.3 % (ref 39.0–52.0)
HEMOGLOBIN: 15.9 g/dL (ref 13.0–17.0)
MCH: 27.9 pg (ref 26.0–34.0)
MCHC: 32.9 g/dL (ref 30.0–36.0)
MCV: 84.7 fL (ref 78.0–100.0)
MPV: 10.1 fL (ref 8.6–12.4)
Platelets: 277 10*3/uL (ref 150–400)
RBC: 5.7 MIL/uL (ref 4.22–5.81)
RDW: 15.5 % (ref 11.5–15.5)
WBC: 5.8 10*3/uL (ref 4.0–10.5)

## 2015-06-27 MED ORDER — TADALAFIL 10 MG PO TABS: ORAL_TABLET | ORAL | Status: DC

## 2015-06-27 NOTE — Progress Notes (Signed)
Subjective: Here for f/u on medications, labs  Since diagnosis of low T and ED several months ago, he initially started on Androgel.  Never really felt improvements on androgel.  We ended up switching to Testosterone injections about 1.5 months ago.  He has been coming q2 weeks for injections and seeing way more improvement in energy, libido, and losing some weight, gaining some muscle, exercising regularly.  Eating more healthy.  He likes this better than the Androgel.    Regarding ED, the stendra didn't really help.  Wants to use cialis prn.   Still has occasionally problems getting or keeping erections.  No other aggravating or relieving factors. No other complaint.  Recently felt some pain in right flank, but thinks it may be muscle pull.  He has been doing weights, exercising, calisthenics.   Wondered about gall bladder, but no nausea or vomiting, no fever, no jaundice.   Only drinks alcohol 1-2 times per week.  Eating healthier overall.   ROS as in subjective   Objective BP 120/86 mmHg  Pulse 76  Temp(Src) 98.2 F (36.8 C) (Oral)  Resp 16  Ht  (2.007 m)  Wt 333 lb 3.2 oz (151.139 kg)  BMI 37.52 kg/m2  Gen: wd, wn, nad Neck: supple, nontender, no mass, no thyromegaly, no lymphadenopathy Heart: RRR, normal s1, s2, no murmurs Lungs clear Abdomen: +striae, +bs, soft, nontneder, no mass, no organomegaly Back nontender Ext: no edema Pulses normal    Assessment: Encounter Diagnoses  Name Primary?  . Hypogonadism in male Yes  . Low testosterone   . Other male erectile dysfunction   . Abdominal pain, right upper quadrant     Plan: Hypogonadism, low T - Discussed his improvements.   Labs today.  Will need to change to monthly Testosterone injections most likely.     ED - stop stendra.  Change to trial of Cialis.  discussed risks/benefits, proper use of medication  Abdominal pain - likely muscle strain.   Advised caution with stretching, weights, and if any worse let me  know.   discussed gall bladder synmptoms as well that would prompt recheck.

## 2015-06-28 ENCOUNTER — Other Ambulatory Visit: Payer: Self-pay | Admitting: Medical

## 2015-06-28 ENCOUNTER — Other Ambulatory Visit: Payer: Self-pay

## 2015-06-28 ENCOUNTER — Telehealth: Payer: Self-pay

## 2015-06-28 DIAGNOSIS — E786 Lipoprotein deficiency: Secondary | ICD-10-CM

## 2015-06-28 DIAGNOSIS — E669 Obesity, unspecified: Secondary | ICD-10-CM

## 2015-06-28 LAB — COMPREHENSIVE METABOLIC PANEL
ALBUMIN: 4.1 g/dL (ref 3.6–5.1)
ALK PHOS: 48 U/L (ref 40–115)
ALT: 31 U/L (ref 9–46)
AST: 22 U/L (ref 10–40)
BILIRUBIN TOTAL: 0.6 mg/dL (ref 0.2–1.2)
BUN: 22 mg/dL (ref 7–25)
CHLORIDE: 103 mmol/L (ref 98–110)
CO2: 26 mmol/L (ref 20–31)
Calcium: 9.5 mg/dL (ref 8.6–10.3)
Creat: 0.84 mg/dL (ref 0.60–1.35)
GLUCOSE: 95 mg/dL (ref 65–99)
POTASSIUM: 5 mmol/L (ref 3.5–5.3)
SODIUM: 140 mmol/L (ref 135–146)
TOTAL PROTEIN: 6.9 g/dL (ref 6.1–8.1)

## 2015-06-28 LAB — TESTOSTERONE: TESTOSTERONE: 652 ng/dL (ref 300–890)

## 2015-06-28 MED ORDER — TESTOSTERONE CYPIONATE 200 MG/ML IM SOLN: 200.0000 mg | INTRAMUSCULAR | Status: DC

## 2015-06-28 NOTE — Telephone Encounter (Signed)
I CALLED PT GAVE HIM LAB RESULTS HE THEN CALLED BACK AND WANTED HIS LIPID # 'S BUT ONE WAS NOT DONE HE SAID THAT WAS HIS REASON FOR COMING IN WILL IT BE OKAY TO HAVE HIM COME IN FOR A NURSE VISIT TO HAVE THAT DRAWN

## 2015-06-28 NOTE — Telephone Encounter (Signed)
Ok.  I advised Alex Atkins to add lipid.

## 2015-06-28 NOTE — Telephone Encounter (Signed)
i have informed pt 

## 2015-06-28 NOTE — Telephone Encounter (Signed)
He has no major risk factors for checking lipids that often.  We just checked them 4 months ago, the only abnormality was low HDL, which should improve with exercise and diet.   At this point, I would only recommend rechecking this once yearly, at most q80mo.  So I have no good reason for billing his insurance for this test at this time.   I would recommend we recheck that lab in a year from the last check.   If he insists on rechecking this, then we can do it 2 months from now which would be a 6 month recheck on lipids .   Otherwise, there is no good medical reason for rechecking this currently.

## 2015-06-28 NOTE — Telephone Encounter (Signed)
Patient said he has been working out and really wants to see if he has had any results

## 2015-06-29 LAB — LIPID PANEL
Cholesterol: 138 mg/dL (ref 125–200)
HDL: 32 mg/dL — AB (ref >=40)
LDL Cholesterol: 90 mg/dL (ref <130)
TRIGLYCERIDES: 82 mg/dL (ref <150)
Total CHOL/HDL Ratio: 4.3 Ratio (ref <=5.0)
VLDL: 16 mg/dL (ref <30)

## 2015-07-01 ENCOUNTER — Telehealth: Payer: Self-pay | Admitting: Medical

## 2015-07-05 NOTE — Telephone Encounter (Signed)
He already tried Viagra and failed.

## 2015-07-05 NOTE — Telephone Encounter (Signed)
P.A. Cialis Denied, pt must try Viagra, do you want to switch?

## 2015-07-14 ENCOUNTER — Telehealth: Payer: Self-pay | Admitting: Medical

## 2015-07-14 ENCOUNTER — Other Ambulatory Visit (INDEPENDENT_AMBULATORY_CARE_PROVIDER_SITE_OTHER): Payer: BLUE CROSS/BLUE SHIELD

## 2015-07-14 DIAGNOSIS — E291 Testicular hypofunction: Secondary | ICD-10-CM

## 2015-07-14 DIAGNOSIS — N528 Other male erectile dysfunction: Secondary | ICD-10-CM

## 2015-07-14 DIAGNOSIS — R7989 Other specified abnormal findings of blood chemistry: Secondary | ICD-10-CM

## 2015-07-14 MED ORDER — TESTOSTERONE CYPIONATE 200 MG/ML IM SOLN
200.0000 mg | Freq: Once | INTRAMUSCULAR | Status: AC
  Administered 2015-07-14: 200 mg via INTRAMUSCULAR

## 2015-07-14 NOTE — Telephone Encounter (Signed)
Resent P.A. With info on trial of Viagra

## 2015-07-14 NOTE — Telephone Encounter (Signed)
Pt needs refill Testosterone injection

## 2015-07-14 NOTE — Telephone Encounter (Signed)
I spoke with pt and he states he has tried Viagra in the past

## 2015-07-17 ENCOUNTER — Other Ambulatory Visit: Payer: Self-pay | Admitting: Medical

## 2015-07-17 NOTE — Telephone Encounter (Signed)
Per Cheri's notes, she called out testosterone on 06/28/15.  Lets verify we called out multi use vial where he would come in once monthly for  injection.  He shouldn't be out of medication, and when was last injection?

## 2015-07-18 ENCOUNTER — Other Ambulatory Visit: Payer: Self-pay

## 2015-07-18 NOTE — Telephone Encounter (Signed)
I called in again.

## 2015-07-18 NOTE — Telephone Encounter (Signed)
P.A. Approved til 11/17/38, faxed pharmacy, pt informed  

## 2015-08-11 ENCOUNTER — Other Ambulatory Visit (INDEPENDENT_AMBULATORY_CARE_PROVIDER_SITE_OTHER): Payer: BLUE CROSS/BLUE SHIELD

## 2015-08-11 DIAGNOSIS — E291 Testicular hypofunction: Secondary | ICD-10-CM

## 2015-08-11 DIAGNOSIS — R7989 Other specified abnormal findings of blood chemistry: Secondary | ICD-10-CM

## 2015-08-11 MED ORDER — TESTOSTERONE CYPIONATE 200 MG/ML IM SOLN
200.0000 mg | Freq: Once | INTRAMUSCULAR | Status: AC
  Administered 2015-08-11: 200 mg via INTRAMUSCULAR

## 2015-09-01 ENCOUNTER — Telehealth: Payer: Self-pay | Admitting: Medical

## 2015-09-01 ENCOUNTER — Other Ambulatory Visit: Payer: Self-pay | Admitting: Medical

## 2015-09-01 DIAGNOSIS — E291 Testicular hypofunction: Secondary | ICD-10-CM

## 2015-09-01 NOTE — Telephone Encounter (Signed)
I don't know where to look to see the monthly injections.      If you can verify he has been coming in every 30 days, then have him come for nurse visit for Testosterone level again to see where the level is.

## 2015-09-01 NOTE — Telephone Encounter (Signed)
Pt called to make an appt for his testosterone shot. He states that now that he is having it once a month by the end of the month all his symptoms return. He would like to know if he can go back to every two weeks. Pt did hold off and making his appt until he hears from you. Pt can be reached at [phone].

## 2015-09-01 NOTE — Telephone Encounter (Signed)
Scheduled patient for labs Monday at 8:30. Please order the labs you would like drawn.

## 2015-09-04 ENCOUNTER — Other Ambulatory Visit: Payer: BLUE CROSS/BLUE SHIELD

## 2015-09-04 DIAGNOSIS — E291 Testicular hypofunction: Secondary | ICD-10-CM

## 2015-09-05 ENCOUNTER — Other Ambulatory Visit: Payer: BLUE CROSS/BLUE SHIELD

## 2015-09-05 LAB — TESTOSTERONE: TESTOSTERONE: 178 ng/dL — AB (ref 300–890)

## 2015-09-29 ENCOUNTER — Other Ambulatory Visit (INDEPENDENT_AMBULATORY_CARE_PROVIDER_SITE_OTHER): Payer: BLUE CROSS/BLUE SHIELD

## 2015-09-29 DIAGNOSIS — E291 Testicular hypofunction: Secondary | ICD-10-CM | POA: Diagnosis not present

## 2015-09-29 DIAGNOSIS — R7989 Other specified abnormal findings of blood chemistry: Secondary | ICD-10-CM

## 2015-09-29 MED ORDER — TESTOSTERONE CYPIONATE 200 MG/ML IM SOLN: 200.0000 mg | INTRAMUSCULAR | Status: DC

## 2015-10-18 ENCOUNTER — Other Ambulatory Visit (INDEPENDENT_AMBULATORY_CARE_PROVIDER_SITE_OTHER): Payer: BLUE CROSS/BLUE SHIELD

## 2015-10-18 DIAGNOSIS — E291 Testicular hypofunction: Secondary | ICD-10-CM

## 2015-10-18 MED ORDER — TESTOSTERONE CYPIONATE 200 MG/ML IM SOLN
200.0000 mg | INTRAMUSCULAR | Status: DC
  Administered 2015-10-18: 200 mg via INTRAMUSCULAR

## 2015-10-29 ENCOUNTER — Emergency Department (HOSPITAL_COMMUNITY)
Admission: EM | Admit: 2015-10-29 | Discharge: 2015-10-29 | Disposition: A | Discharge status: Home / Self Care | Payer: BLUE CROSS/BLUE SHIELD | Source: Home / Self Care | Attending: Emergency Medicine | Admitting: Emergency Medicine

## 2015-10-29 ENCOUNTER — Encounter (HOSPITAL_COMMUNITY): Payer: Self-pay | Admitting: *Deleted

## 2015-10-29 ENCOUNTER — Emergency Department (INDEPENDENT_AMBULATORY_CARE_PROVIDER_SITE_OTHER): Payer: BLUE CROSS/BLUE SHIELD

## 2015-10-29 DIAGNOSIS — S2231XA Fracture of one rib, right side, initial encounter for closed fracture: Secondary | ICD-10-CM

## 2015-10-29 MED ORDER — HYDROCODONE-ACETAMINOPHEN 5-325 MG PO TABS: 1.0000 | ORAL_TABLET | Freq: Four times a day (QID) | ORAL | Status: DC | PRN
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
## 2015-10-30 ENCOUNTER — Emergency Department (HOSPITAL_COMMUNITY): Payer: BLUE CROSS/BLUE SHIELD

## 2015-10-30 ENCOUNTER — Encounter (HOSPITAL_COMMUNITY): Payer: Self-pay | Admitting: *Deleted

## 2015-10-30 ENCOUNTER — Emergency Department (HOSPITAL_COMMUNITY)
Admission: EM | Admit: 2015-10-30 | Discharge: 2015-10-30 | Disposition: A | Discharge status: Home / Self Care | Payer: BLUE CROSS/BLUE SHIELD | Attending: Emergency Medicine | Admitting: Emergency Medicine

## 2015-10-30 ENCOUNTER — Emergency Department (INDEPENDENT_AMBULATORY_CARE_PROVIDER_SITE_OTHER)
Admission: EM | Admit: 2015-10-30 | Discharge: 2015-10-30 | Disposition: A | Discharge status: Nursing Home (Includes ICF) | Payer: BLUE CROSS/BLUE SHIELD | Source: Home / Self Care | Attending: Family Medicine | Admitting: Family Medicine

## 2015-10-30 ENCOUNTER — Encounter (HOSPITAL_COMMUNITY): Payer: Self-pay | Admitting: Emergency Medicine

## 2015-10-30 DIAGNOSIS — R4781 Slurred speech: Secondary | ICD-10-CM

## 2015-10-30 DIAGNOSIS — G44309 Post-traumatic headache, unspecified, not intractable: Secondary | ICD-10-CM

## 2015-10-30 DIAGNOSIS — S060X0A Concussion without loss of consciousness, initial encounter: Secondary | ICD-10-CM

## 2015-10-30 DIAGNOSIS — S199XXA Unspecified injury of neck, initial encounter: Secondary | ICD-10-CM | POA: Insufficient documentation

## 2015-10-30 DIAGNOSIS — S161XXD Strain of muscle, fascia and tendon at neck level, subsequent encounter: Secondary | ICD-10-CM | POA: Diagnosis not present

## 2015-10-30 DIAGNOSIS — S0990XA Unspecified injury of head, initial encounter: Secondary | ICD-10-CM

## 2015-10-30 DIAGNOSIS — Z79899 Other long term (current) drug therapy: Secondary | ICD-10-CM | POA: Insufficient documentation

## 2015-10-30 DIAGNOSIS — Y998 Other external cause status: Secondary | ICD-10-CM | POA: Insufficient documentation

## 2015-10-30 DIAGNOSIS — Y9289 Other specified places as the place of occurrence of the external cause: Secondary | ICD-10-CM | POA: Insufficient documentation

## 2015-10-30 DIAGNOSIS — E669 Obesity, unspecified: Secondary | ICD-10-CM | POA: Diagnosis not present

## 2015-10-30 DIAGNOSIS — R42 Dizziness and giddiness: Secondary | ICD-10-CM | POA: Insufficient documentation

## 2015-10-30 DIAGNOSIS — Y9389 Activity, other specified: Secondary | ICD-10-CM | POA: Diagnosis not present

## 2015-10-30 DIAGNOSIS — Z8719 Personal history of other diseases of the digestive system: Secondary | ICD-10-CM | POA: Diagnosis not present
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
## 2015-10-30 NOTE — ED Notes (Signed)
Called at 20:10 did not answer
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
## 2015-10-30 NOTE — Discharge Instructions (Signed)
Your CT scan was normal. Your exam was normal. Your symptoms are likely due to a mild concussion. Please follow-up with your primary care provider within one week. Return to the ER for new or worsening symptoms.   Concussion, Adult A concussion, or closed-head injury, is a brain injury caused by a direct blow to the head or by a quick and sudden movement (jolt) of the head or neck. Concussions are usually not life-threatening. Even so, the effects of a concussion can be serious. If you have had a concussion before, you are more likely to experience concussion-like symptoms after a direct blow to the head.  CAUSES  Direct blow to the head, such as from running into another player during a soccer game, being hit in a fight, or hitting your head on a hard surface.  A jolt of the head or neck that causes the brain to move back and forth inside the skull, such as in a car crash. SIGNS AND SYMPTOMS The signs of a concussion can be hard to notice. Early on, they may be missed by you, family members, and health care providers. You may look fine but act or feel differently. Symptoms are usually temporary, but they may last for days, weeks, or even longer. Some symptoms may appear right away while others may not show up for hours or days. Every head injury is different. Symptoms include:  Mild to moderate headaches that will not go away.  A feeling of pressure inside your head.  Having more trouble than usual:  Learning or remembering things you have heard.  Answering questions.  Paying attention or concentrating.  Organizing daily tasks.  Making decisions and solving problems.  Slowness in thinking, acting or reacting, speaking, or reading.  Getting lost or being easily confused.  Feeling tired all the time or lacking energy (fatigued).  Feeling drowsy.  Sleep disturbances.  Sleeping more than usual.  Sleeping less than usual.  Trouble falling asleep.  Trouble sleeping  (insomnia).  Loss of balance or feeling lightheaded or dizzy.  Nausea or vomiting.  Numbness or tingling.  Increased sensitivity to:  Sounds.  Lights.  Distractions.  Vision problems or eyes that tire easily.  Diminished sense of taste or smell.  Ringing in the ears.  Mood changes such as feeling sad or anxious.  Becoming easily irritated or angry for little or no reason.  Lack of motivation.  Seeing or hearing things other people do not see or hear (hallucinations). DIAGNOSIS Your health care provider can usually diagnose a concussion based on a description of your injury and symptoms. He or she will ask whether you passed out (lost consciousness) and whether you are having trouble remembering events that happened right before and during your injury. Your evaluation might include:  A brain scan to look for signs of injury to the brain. Even if the test shows no injury, you may still have a concussion.  Blood tests to be sure other problems are not present. TREATMENT  Concussions are usually treated in an emergency department, in urgent care, or at a clinic. You may need to stay in the hospital overnight for further treatment.  Tell your health care provider if you are taking any medicines, including prescription medicines, over-the-counter medicines, and natural remedies. Some medicines, such as blood thinners (anticoagulants) and aspirin, may increase the chance of complications. Also tell your health care provider whether you have had alcohol or are taking illegal drugs. This information may affect treatment.  Your health  care provider will send you home with important instructions to follow.  How fast you will recover from a concussion depends on many factors. These factors include how severe your concussion is, what part of your brain was injured, your age, and how healthy you were before the concussion.  Most people with mild injuries recover fully. Recovery can  take time. In general, recovery is slower in older persons. Also, persons who have had a concussion in the past or have other medical problems may find that it takes longer to recover from their current injury. HOME CARE INSTRUCTIONS General Instructions  Carefully follow the directions your health care provider gave you.  Only take over-the-counter or prescription medicines for pain, discomfort, or fever as directed by your health care provider.  Take only those medicines that your health care provider has approved.  Do not drink alcohol until your health care provider says you are well enough to do so. Alcohol and certain other drugs may slow your recovery and can put you at risk of further injury.  If it is harder than usual to remember things, write them down.  If you are easily distracted, try to do one thing at a time. For example, do not try to watch TV while fixing dinner.  Talk with family members or close friends when making important decisions.  Keep all follow-up appointments. Repeated evaluation of your symptoms is recommended for your recovery.  Watch your symptoms and tell others to do the same. Complications sometimes occur after a concussion. Older adults with a brain injury may have a higher risk of serious complications, such as a blood clot on the brain.  Tell your teachers, school nurse, school counselor, coach, athletic trainer, or work Production designer, theatre/television/film about your injury, symptoms, and restrictions. Tell them about what you can or cannot do. They should watch for:  Increased problems with attention or concentration.  Increased difficulty remembering or learning new information.  Increased time needed to complete tasks or assignments.  Increased irritability or decreased ability to cope with stress.  Increased symptoms.  Rest. Rest helps the brain to heal. Make sure you:  Get plenty of sleep at night. Avoid staying up late at night.  Keep the same bedtime hours on  weekends and weekdays.  Rest during the day. Take daytime naps or rest breaks when you feel tired.  Limit activities that require a lot of thought or concentration. These include:  Doing homework or job-related work.  Watching TV.  Working on the computer.  Avoid any situation where there is potential for another head injury (football, hockey, soccer, basketball, martial arts, downhill snow sports and horseback riding). Your condition will get worse every time you experience a concussion. You should avoid these activities until you are evaluated by the appropriate follow-up health care providers. Returning To Your Regular Activities You will need to return to your normal activities slowly, not all at once. You must give your body and brain enough time for recovery.  Do not return to sports or other athletic activities until your health care provider tells you it is safe to do so.  Ask your health care provider when you can drive, ride a bicycle, or operate heavy machinery. Your ability to react may be slower after a brain injury. Never do these activities if you are dizzy.  Ask your health care provider about when you can return to work or school. Preventing Another Concussion It is very important to avoid another brain injury, especially before  you have recovered. In rare cases, another injury can lead to permanent brain damage, brain swelling, or death. The risk of this is greatest during the first 7-10 days after a head injury. Avoid injuries by:  Wearing a seat belt when riding in a car.  Drinking alcohol only in moderation.  Wearing a helmet when biking, skiing, skateboarding, skating, or doing similar activities.  Avoiding activities that could lead to a second concussion, such as contact or recreational sports, until your health care provider says it is okay.  Taking safety measures in your home.  Remove clutter and tripping hazards from floors and stairways.  Use grab bars  in bathrooms and handrails by stairs.  Place non-slip mats on floors and in bathtubs.  Improve lighting in dim areas. SEEK MEDICAL CARE IF:  You have increased problems paying attention or concentrating.  You have increased difficulty remembering or learning new information.  You need more time to complete tasks or assignments than before.  You have increased irritability or decreased ability to cope with stress.  You have more symptoms than before. Seek medical care if you have any of the following symptoms for more than 2 weeks after your injury:  Lasting (chronic) headaches.  Dizziness or balance problems.  Nausea.  Vision problems.  Increased sensitivity to noise or light.  Depression or mood swings.  Anxiety or irritability.  Memory problems.  Difficulty concentrating or paying attention.  Sleep problems.  Feeling tired all the time. SEEK IMMEDIATE MEDICAL CARE IF:  You have severe or worsening headaches. These may be a sign of a blood clot in the brain.  You have weakness (even if only in one hand, leg, or part of the face).  You have numbness.  You have decreased coordination.  You vomit repeatedly.  You have increased sleepiness.  One pupil is larger than the other.  You have convulsions.  You have slurred speech.  You have increased confusion. This may be a sign of a blood clot in the brain.  You have increased restlessness, agitation, or irritability.  You are unable to recognize people or places.  You have neck pain.  It is difficult to wake you up.  You have unusual behavior changes.  You lose consciousness. MAKE SURE YOU:  Understand these instructions.  Will watch your condition.  Will get help right away if you are not doing well or get worse.   This information is not intended to replace advice given to you by your health care provider. Make sure you discuss any questions you have with your health care provider.     Document Released: 01/25/2004 Document Revised: 11/25/2014 Document Reviewed: 05/27/2013 Elsevier Interactive Patient Education [employer]

## 2015-10-30 NOTE — ED Notes (Signed)
Called at [facility]20:20

## 2015-10-30 NOTE — ED Notes (Signed)
PA at bedside at this time.  

## 2015-11-06 ENCOUNTER — Encounter: Payer: Self-pay | Admitting: Medical

## 2015-11-06 ENCOUNTER — Ambulatory Visit (INDEPENDENT_AMBULATORY_CARE_PROVIDER_SITE_OTHER): Payer: BLUE CROSS/BLUE SHIELD | Admitting: Medical

## 2015-11-06 VITALS — BP 108/70 | HR 74 | Wt 328.0 lb

## 2015-11-06 DIAGNOSIS — S2241XD Multiple fractures of ribs, right side, subsequent encounter for fracture with routine healing: Secondary | ICD-10-CM

## 2015-11-06 DIAGNOSIS — T07XXXA Unspecified multiple injuries, initial encounter: Secondary | ICD-10-CM

## 2015-11-06 DIAGNOSIS — R519 Headache, unspecified: Secondary | ICD-10-CM

## 2015-11-06 DIAGNOSIS — F0781 Postconcussional syndrome: Secondary | ICD-10-CM

## 2015-11-06 DIAGNOSIS — H9312 Tinnitus, left ear: Secondary | ICD-10-CM

## 2015-11-06 DIAGNOSIS — R51 Headache: Secondary | ICD-10-CM | POA: Diagnosis not present

## 2015-11-06 DIAGNOSIS — R7989 Other specified abnormal findings of blood chemistry: Secondary | ICD-10-CM

## 2015-11-06 DIAGNOSIS — E291 Testicular hypofunction: Secondary | ICD-10-CM

## 2015-11-06 DIAGNOSIS — T148 Other injury of unspecified body region: Secondary | ICD-10-CM
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
## 2015-11-14 MED ORDER — TESTOSTERONE CYPIONATE 200 MG/ML IM SOLN
200.0000 mg | INTRAMUSCULAR | Status: DC
  Administered 2015-11-06: 200 mg via INTRAMUSCULAR

## 2015-11-14 NOTE — Addendum Note (Signed)
Addended by: Kieth BrightlyLAWSON, Loneta Tamplin M on: 11/14/2015 10:16 AM   Modules accepted: Orders

## 2015-11-16 ENCOUNTER — Telehealth: Payer: Self-pay | Admitting: Medical

## 2015-11-16 NOTE — Telephone Encounter (Signed)
Pt said at his last appt he was asked by Vincenza HewsShane to come back in for labs only in 1-2 weeks so he scheduled a lab appt on 1/3. Is this correct?

## 2015-11-16 NOTE — Telephone Encounter (Signed)
Yes have him come in within a few weeks for updated TST blood level (nurse visit) as we may need to adjust his dosing.

## 2015-11-21 ENCOUNTER — Other Ambulatory Visit (INDEPENDENT_AMBULATORY_CARE_PROVIDER_SITE_OTHER): Payer: BLUE CROSS/BLUE SHIELD

## 2015-11-21 DIAGNOSIS — E291 Testicular hypofunction: Secondary | ICD-10-CM | POA: Diagnosis not present

## 2015-11-21 DIAGNOSIS — R7989 Other specified abnormal findings of blood chemistry: Secondary | ICD-10-CM

## 2015-11-21 DIAGNOSIS — E669 Obesity, unspecified: Secondary | ICD-10-CM

## 2015-11-21 DIAGNOSIS — E786 Lipoprotein deficiency: Secondary | ICD-10-CM

## 2015-11-21 LAB — HEPATIC FUNCTION PANEL
ALBUMIN: 4.4 g/dL (ref 3.6–5.1)
ALT: 42 U/L (ref 9–46)
AST: 27 U/L (ref 10–40)
Alkaline Phosphatase: 49 U/L (ref 40–115)
BILIRUBIN DIRECT: 0.2 mg/dL (ref <=0.2)
Indirect Bilirubin: 0.7 mg/dL (ref 0.2–1.2)
TOTAL PROTEIN: 6.9 g/dL (ref 6.1–8.1)
Total Bilirubin: 0.9 mg/dL (ref 0.2–1.2)

## 2015-11-21 LAB — LIPID PANEL
CHOL/HDL RATIO: 4.1 ratio (ref <=5.0)
CHOLESTEROL: 143 mg/dL (ref 125–200)
HDL: 35 mg/dL — AB (ref >=40)
LDL Cholesterol: 82 mg/dL (ref <130)
TRIGLYCERIDES: 130 mg/dL (ref <150)
VLDL: 26 mg/dL (ref <30)

## 2015-11-22 ENCOUNTER — Other Ambulatory Visit: Payer: Self-pay | Admitting: Medical

## 2015-11-22 LAB — TESTOSTERONE: TESTOSTERONE: 192 ng/dL — AB (ref 300–890)

## 2015-11-22 MED ORDER — TESTOSTERONE 30 MG/ACT TD SOLN: 4.0000 | Freq: Every day | TRANSDERMAL | Status: DC

## 2015-11-22 MED ORDER — TESTOSTERONE CYPIONATE 200 MG/ML IM SOLN
200.0000 mg | INTRAMUSCULAR | Status: DC
Start: 2015-11-21 — End: 2015-11-22
  Administered 2015-11-21: 200 mg via INTRAMUSCULAR

## 2015-11-22 NOTE — Addendum Note (Signed)
Addended by: Kieth BrightlyLAWSON, Evangelina Delancey M on: 11/22/2015 08:02 AM   Modules accepted: Orders

## 2015-11-22 NOTE — Addendum Note (Signed)
Addended by: Kieth BrightlyLAWSON, Jamisen Hawes M on: 11/22/2015 08:09 AM   Modules accepted: Orders

## 2015-11-26 ENCOUNTER — Telehealth: Payer: Self-pay | Admitting: Medical

## 2015-11-26 NOTE — Telephone Encounter (Signed)
P.A. AXIRON 

## 2015-11-27 ENCOUNTER — Ambulatory Visit: Payer: BLUE CROSS/BLUE SHIELD | Admitting: Medical

## 2015-11-27 ENCOUNTER — Ambulatory Visit (INDEPENDENT_AMBULATORY_CARE_PROVIDER_SITE_OTHER): Payer: BLUE CROSS/BLUE SHIELD | Admitting: Medical

## 2015-11-27 ENCOUNTER — Encounter: Payer: Self-pay | Admitting: Medical

## 2015-11-27 VITALS — BP 130/90 | HR 81 | Wt 333.0 lb

## 2015-11-27 DIAGNOSIS — E291 Testicular hypofunction: Secondary | ICD-10-CM | POA: Diagnosis not present

## 2015-11-27 MED ORDER — TESTOSTERONE CYPIONATE 200 MG/ML IM SOLN
100.0000 mg | Freq: Once | INTRAMUSCULAR | Status: AC
  Administered 2015-11-27: 100 mg via INTRAMUSCULAR

## 2015-11-27 NOTE — Progress Notes (Signed)
Subjective: Here to discuss testosterone therapy.    Since starting therapy back in 02/2015, he went on Androgel briefly, but decided to pursue injections which he has been doing after he felt the Androgel wasn't giving him the response he wanted.   Been using TST injections every 2 weeks up until August when we changes to monthly then since has not felt like its working as well.   Levels haven't really improved, but he is worried we are just checking levels at end of the 3-4 weeks after injection, although some of the lab results have been random.    He wants level today as he just got 200mg  injection last week.   He would like to have closer lab monitoring to titrate the levels.  He does feel like the medication is working, just wonders if he needs to be on different mg vs different frequency of injection. He also feels like the different CMAs here give the injection in different ways, not consistent.  No other aggravating or relieving factors. No other complaint.   Past Medical History  Diagnosis Date  . Wears glasses   . Obesity   . Joint pain   . 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 GERD (gastroesophageal reflux disease)    ROS as in subjective    Objective: BP 130/90 mmHg  Pulse 81  Wt 333 lb (151.048 kg)  Gen: wd, wn, nad Buttock after injection was tender at injection site with mild fullness/nodular texture, blood stain on underwear     Assessment: Encounter Diagnosis  Name Primary?  . Hypogonadism in male Yes    Plan: Will give 0.5mg  TST today or 100mg  to add to the 200mg  given last week.  Thus, we are changing to 300mg  for now, plan to get TST lab today and in 2 weeks to monitor therapy.    Of note after injection he got a little upset with the injection today and bleeding thereafter.  We monitored him for 20 minutes and he was fine, discussed reasons  why there would be bleeding.  Talking with CMA, she did pull needle out quickly which may have pulled some blood out with the needle, also can get bleeding with any injection.  I don't feel that CMA injected into vessel, but discussed case with nurse, patient and supervising physician.   F/u as planned.

## 2015-11-28 ENCOUNTER — Other Ambulatory Visit: Payer: Self-pay | Admitting: Medical

## 2015-11-28 DIAGNOSIS — E291 Testicular hypofunction: Secondary | ICD-10-CM

## 2015-11-28 LAB — TESTOSTERONE: TESTOSTERONE: 558 ng/dL (ref 250–827)

## 2015-11-28 NOTE — Telephone Encounter (Signed)
Pt is aware.  

## 2015-11-28 NOTE — Telephone Encounter (Signed)
P.A. Approved Axiron, called pharmacy & went thru for $20.  Called pt & informed, he states he was still doing the injections for now.

## 2015-11-28 NOTE — Telephone Encounter (Signed)
Yes, make sure he knows that although insurance approved Axiron, we are still on same plan to use the TST injections for now as discussed yesterday.

## 2015-12-11 ENCOUNTER — Other Ambulatory Visit (INDEPENDENT_AMBULATORY_CARE_PROVIDER_SITE_OTHER): Payer: BLUE CROSS/BLUE SHIELD

## 2015-12-11 ENCOUNTER — Telehealth: Payer: Self-pay

## 2015-12-11 DIAGNOSIS — E291 Testicular hypofunction: Secondary | ICD-10-CM | POA: Diagnosis not present

## 2015-12-11 MED ORDER — TESTOSTERONE CYPIONATE 200 MG/ML IM SOLN
300.0000 mg | Freq: Once | INTRAMUSCULAR | Status: AC
  Administered 2015-12-11: 300 mg via INTRAMUSCULAR

## 2015-12-11 NOTE — Telephone Encounter (Signed)
Pt would like fasting labs as well as testosterone level check and injection

## 2015-12-12 ENCOUNTER — Other Ambulatory Visit: Payer: Self-pay | Admitting: Medical

## 2015-12-12 DIAGNOSIS — E291 Testicular hypofunction: Secondary | ICD-10-CM

## 2015-12-12 NOTE — Telephone Encounter (Signed)
I'd like to see a level 3 weeks after injection.  So have him come in at about 3 weeks post injection.  Order is in system ready.

## 2015-12-12 NOTE — Telephone Encounter (Signed)
Pt is aware and appt made  

## 2015-12-12 NOTE — Telephone Encounter (Signed)
His last injection was yesterday, when does he need to come in?

## 2015-12-12 NOTE — Telephone Encounter (Signed)
Have him come in for TST level,and verify # of weeks since last injection.

## 2015-12-25 ENCOUNTER — Other Ambulatory Visit (INDEPENDENT_AMBULATORY_CARE_PROVIDER_SITE_OTHER): Payer: BLUE CROSS/BLUE SHIELD

## 2015-12-25 DIAGNOSIS — E291 Testicular hypofunction: Secondary | ICD-10-CM

## 2015-12-25 MED ORDER — TESTOSTERONE CYPIONATE 100 MG/ML IM SOLN
300.0000 mg | INTRAMUSCULAR | Status: AC
  Administered 2015-12-25 – 2016-01-23 (×2): 300 mg via INTRAMUSCULAR

## 2015-12-26 LAB — TESTOSTERONE: TESTOSTERONE: 767 ng/dL (ref 250–827)

## 2016-01-23 ENCOUNTER — Telehealth: Payer: Self-pay | Admitting: Medical

## 2016-01-23 ENCOUNTER — Other Ambulatory Visit: Payer: Self-pay | Admitting: Medical

## 2016-01-23 ENCOUNTER — Other Ambulatory Visit (INDEPENDENT_AMBULATORY_CARE_PROVIDER_SITE_OTHER): Payer: BLUE CROSS/BLUE SHIELD

## 2016-01-23 DIAGNOSIS — E291 Testicular hypofunction: Secondary | ICD-10-CM | POA: Diagnosis not present

## 2016-01-23 MED ORDER — TESTOSTERONE CYPIONATE 200 MG/ML IM SOLN: INTRAMUSCULAR | Status: DC

## 2016-01-23 NOTE — Telephone Encounter (Signed)
rx ready 

## 2016-01-23 NOTE — Progress Notes (Signed)
Gave 300mg  (1.75ml) of testosterone

## 2016-01-23 NOTE — Telephone Encounter (Signed)
Pt is coming in today for injection. He would like to pick up a rx for refills while he is here. This is for Axiron. Pt will be here at 2.00.

## 2016-02-06 ENCOUNTER — Other Ambulatory Visit (INDEPENDENT_AMBULATORY_CARE_PROVIDER_SITE_OTHER): Payer: BLUE CROSS/BLUE SHIELD

## 2016-02-06 DIAGNOSIS — E291 Testicular hypofunction: Secondary | ICD-10-CM

## 2016-02-06 DIAGNOSIS — R7989 Other specified abnormal findings of blood chemistry: Secondary | ICD-10-CM

## 2016-02-06 MED ORDER — TESTOSTERONE CYPIONATE 200 MG/ML IM SOLN
300.0000 mg | Freq: Once | INTRAMUSCULAR | Status: AC
  Administered 2016-02-06: 300 mg via INTRAMUSCULAR

## 2016-02-20 ENCOUNTER — Other Ambulatory Visit (INDEPENDENT_AMBULATORY_CARE_PROVIDER_SITE_OTHER): Payer: BLUE CROSS/BLUE SHIELD

## 2016-02-20 DIAGNOSIS — E291 Testicular hypofunction: Secondary | ICD-10-CM

## 2016-02-20 MED ORDER — TESTOSTERONE CYPIONATE 200 MG/ML IM SOLN
300.0000 mg | INTRAMUSCULAR | Status: AC
  Administered 2016-02-20 – 2016-05-01 (×2): 300 mg via INTRAMUSCULAR
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
## 2016-03-05 ENCOUNTER — Other Ambulatory Visit (INDEPENDENT_AMBULATORY_CARE_PROVIDER_SITE_OTHER): Payer: BLUE CROSS/BLUE SHIELD

## 2016-03-05 DIAGNOSIS — E291 Testicular hypofunction: Secondary | ICD-10-CM

## 2016-03-05 DIAGNOSIS — R7989 Other specified abnormal findings of blood chemistry: Secondary | ICD-10-CM

## 2016-03-05 MED ORDER — TESTOSTERONE CYPIONATE 200 MG/ML IM SOLN
300.0000 mg | Freq: Once | INTRAMUSCULAR | Status: AC
  Administered 2016-03-05: 300 mg via INTRAMUSCULAR

## 2016-03-18 ENCOUNTER — Other Ambulatory Visit (INDEPENDENT_AMBULATORY_CARE_PROVIDER_SITE_OTHER): Payer: BLUE CROSS/BLUE SHIELD

## 2016-03-18 ENCOUNTER — Encounter: Payer: Self-pay | Admitting: Medical

## 2016-03-18 ENCOUNTER — Telehealth: Payer: Self-pay

## 2016-03-18 DIAGNOSIS — E291 Testicular hypofunction: Secondary | ICD-10-CM

## 2016-03-18 MED ORDER — TESTOSTERONE CYPIONATE 200 MG/ML IM SOLN
300.0000 mg | INTRAMUSCULAR | Status: AC
  Administered 2016-03-18 – 2016-05-14 (×2): 300 mg via INTRAMUSCULAR

## 2016-03-18 NOTE — Telephone Encounter (Signed)
D.  Please call and inquire as this is unlike him to no show

## 2016-03-18 NOTE — Telephone Encounter (Signed)
This patient no showed for their appointment today.Which of the following is necessary for this patient.   A) No follow-up necessary   B) Follow-up urgent. Locate Patient Immediately.   C) Follow-up necessary. Contact patient and Schedule visit in ____ Days.   D) Follow-up Advised. Contact patient and Schedule visit in ____ Days. 

## 2016-03-19 NOTE — Telephone Encounter (Signed)
Pt wrote down time wrong. He came in at 2.

## 2016-03-25 ENCOUNTER — Ambulatory Visit (INDEPENDENT_AMBULATORY_CARE_PROVIDER_SITE_OTHER): Payer: BLUE CROSS/BLUE SHIELD | Admitting: Medical

## 2016-03-25 ENCOUNTER — Encounter: Payer: Self-pay | Admitting: Medical

## 2016-03-25 VITALS — BP 120/80 | HR 80 | Ht 79.5 in | Wt 333.0 lb

## 2016-03-25 DIAGNOSIS — R0789 Other chest pain: Secondary | ICD-10-CM | POA: Diagnosis not present

## 2016-03-25 DIAGNOSIS — Z77128 Contact with and (suspected) exposure to other hazards in the physical environment: Secondary | ICD-10-CM

## 2016-03-25 DIAGNOSIS — E669 Obesity, unspecified: Secondary | ICD-10-CM

## 2016-03-25 DIAGNOSIS — E291 Testicular hypofunction: Secondary | ICD-10-CM

## 2016-03-25 DIAGNOSIS — Z113 Encounter for screening for infections with a predominantly sexual mode of transmission: Secondary | ICD-10-CM | POA: Diagnosis not present

## 2016-03-25 DIAGNOSIS — Z9189 Other specified personal risk factors, not elsewhere classified: Secondary | ICD-10-CM | POA: Diagnosis not present

## 2016-03-25 DIAGNOSIS — K219 Gastro-esophageal reflux disease without esophagitis: Secondary | ICD-10-CM | POA: Diagnosis not present

## 2016-03-25 DIAGNOSIS — I868 Varicose veins of other specified sites: Secondary | ICD-10-CM

## 2016-03-25 DIAGNOSIS — M6208 Separation of muscle (nontraumatic), other site: Secondary | ICD-10-CM | POA: Diagnosis not present

## 2016-03-25 DIAGNOSIS — I8390 Asymptomatic varicose veins of unspecified lower extremity: Secondary | ICD-10-CM

## 2016-03-25 DIAGNOSIS — Z0000 Encounter for general adult medical examination without abnormal findings: Secondary | ICD-10-CM | POA: Diagnosis not present

## 2016-03-25 DIAGNOSIS — N529 Male erectile dysfunction, unspecified: Secondary | ICD-10-CM

## 2016-03-25 LAB — CBC
HCT: 51.5 % — ABNORMAL HIGH (ref 38.5–50.0)
Hemoglobin: 17.1 g/dL (ref 13.2–17.1)
MCH: 28.2 pg (ref 27.0–33.0)
MCHC: 33.2 g/dL (ref 32.0–36.0)
MCV: 84.8 fL (ref 80.0–100.0)
MPV: 10.2 fL (ref 7.5–12.5)
Platelets: 240 10*3/uL (ref 140–400)
RBC: 6.07 MIL/uL — AB (ref 4.20–5.80)
RDW: 15.2 % — ABNORMAL HIGH (ref 11.0–15.0)
WBC: 6.8 10*3/uL (ref 4.0–10.5)

## 2016-03-25 LAB — HEMOGLOBIN A1C
HEMOGLOBIN A1C: 5.8 % — AB (ref <5.7)
Mean Plasma Glucose: 120 mg/dL

## 2016-03-25 LAB — COMPREHENSIVE METABOLIC PANEL
ALK PHOS: 47 U/L (ref 40–115)
ALT: 37 U/L (ref 9–46)
AST: 30 U/L (ref 10–40)
Albumin: 4.2 g/dL (ref 3.6–5.1)
BILIRUBIN TOTAL: 0.5 mg/dL (ref 0.2–1.2)
BUN: 13 mg/dL (ref 7–25)
CALCIUM: 9.4 mg/dL (ref 8.6–10.3)
CO2: 29 mmol/L (ref 20–31)
CREATININE: 1.09 mg/dL (ref 0.60–1.35)
Chloride: 100 mmol/L (ref 98–110)
GLUCOSE: 81 mg/dL (ref 65–99)
Potassium: 4.1 mmol/L (ref 3.5–5.3)
SODIUM: 139 mmol/L (ref 135–146)
Total Protein: 6.8 g/dL (ref 6.1–8.1)

## 2016-03-25 LAB — POCT URINALYSIS DIPSTICK
BILIRUBIN UA: NEGATIVE
Glucose, UA: NEGATIVE
KETONES UA: NEGATIVE
Leukocytes, UA: NEGATIVE
NITRITE UA: NEGATIVE
PH UA: 7
PROTEIN UA: NEGATIVE
RBC UA: NEGATIVE
Spec Grav, UA: 1.025
Urobilinogen, UA: NEGATIVE

## 2016-03-25 LAB — HIV ANTIBODY (ROUTINE TESTING W REFLEX): HIV 1&2 Ab, 4th Generation: NONREACTIVE

## 2016-03-25 MED ORDER — TADALAFIL 10 MG PO TABS: ORAL_TABLET | ORAL | Status: DC

## 2016-03-25 NOTE — Addendum Note (Signed)
Addended by: Kieth BrightlyLAWSON, Warda Mcqueary M on: 03/25/2016 03:36 PM   Modules accepted: Orders, SmartSet

## 2016-03-25 NOTE — Progress Notes (Signed)
Subjective:   HPI  Alex Atkins is a 45 y.o. male who presents for a complete physical.  Concerns: Having some irritation at anus, thinks he may have a small hemorrhoid recently, but thinks its gone currently  Having some problems getting and keeping erections of late.  Wants to make sure he has refills on medication for erections.  He notes some heaviness in chest.  Has been exercising regularly, has lost weight recently. Denies associated nausea, sweats, SOB, edema, or arm pain.  Felt the heaviness one morning getting up.  Didn't use inhaler that day.   Did use inhaler few weeks ago (Asmanex), but other than that day, hasn't had other SOB or dyspnea.     Here for recheck on hypogonadism, low T, ED.   Wants hernia at upper abdomen looked at.  Not causing pain  Reviewed their medical, surgical, family, social, medication, and allergy history and updated chart as appropriate.  Past Medical History  Diagnosis Date  . Wears glasses   . Obesity   . Joint pain   . 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 GERD (gastroesophageal reflux disease)     Past Surgical History  Procedure Laterality Date  . Nasal septum surgery  1993    in college   . Esophagogastroduodenoscopy      remote past    Social History   Social History  . Marital Status: Divorced    Spouse Name: N/A  . Number of Children: N/A  . Years of Education: N/A   Occupational History  . Not on file.   Social History Main Topics  . Smoking status: Never Smoker   . Smokeless tobacco: Not on file  . Alcohol Use: 1.2 oz/week    1 Glasses of wine, 1 Shots of liquor per week  . Drug Use: No  . Sexual Activity: Not on file   Other Topics Concern  . Not on file   Social History Narrative   [employer], lives alone.  Has 45 yo son.   [place] lives in [place]  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   Was athletic up until 2014.    Family History  Problem Relation Age of Onset  . Cancer Mother     pancreatic  . Diabetes Father   . Kidney disease Father   . Heart disease Father 2260  . Stroke Neg Hx   . COPD Neg Hx   . Asthma Neg Hx      Current outpatient prescriptions:  Marland Kitchen.  Misc Natural Products (OSTEO BI-FLEX ADV DOUBLE ST) CAPS, Take by mouth., Disp: , Rfl:  .  Albuterol Sulfate (PROAIR RESPICLICK) 108 (90 BASE) MCG/ACT AEPB, Inhale 1 Inhaler into the lungs every 6 (six) hours as needed. (Patient not taking: Reported on 04/27/2015), Disp: 1 each, Rfl: 0 .  Fish Oil-Krill Oil (KRILL OIL PLUS PO), Take by mouth. Reported on 03/25/2016, Disp: , Rfl:  .  mometasone (ASMANEX) 220 MCG/INH inhaler, Inhale 1 puff into the lungs 2 (two) times daily. (Patient not taking: Reported on 06/27/2015), Disp: 1 Inhaler, Rfl: 2 .  tadalafil (CIALIS) 10 MG tablet, 1/2-1 tablet po daily prn, Disp: 10 tablet, Rfl: 11  Current facility-administered medications:  .  testosterone cypionate (DEPOTESTOSTERONE CYPIONATE) injection 300 mg, 300 mg, Intramuscular, Q14 Days, Ronnald NianJohn C Lalonde, MD, 300 mg at  02/20/16 1646 .  testosterone cypionate (DEPOTESTOSTERONE CYPIONATE) injection 300 mg, 300 mg, Intramuscular, Q14 Days, Kermit Balo Tysinger, PA-C, 300 mg at 03/18/16 1420 .  testosterone cypionate (DEPOTESTOTERONE CYPIONATE) injection 300 mg, 300 mg, Intramuscular, Q14 Days, Jac Canavan, PA-C, 300 mg at 01/23/16 1509  No Known Allergies   Review of Systems Constitutional: -fever, -chills, -sweats, -unexpected weight change, -decreased appetite, -fatigue Allergy: -sneezing, -itching, -congestion Dermatology: -changing moles, --rash, -lumps ENT: -runny nose, -ear pain, -sore throat, -hoarseness, -sinus pain, -teeth pain, - ringing in ears, -hearing loss, -nosebleeds Cardiology: -chest pain, -palpitations, -swelling, -difficulty breathing when lying flat, -waking up short of breath Respiratory: -cough, -shortness  of breath, -difficulty breathing with exercise or exertion, -wheezing, -coughing up blood Gastroenterology: -abdominal pain, -nausea, -vomiting, -diarrhea, -constipation, -blood in stool, -changes in bowel movement, -difficulty swallowing or eating Hematology: -bleeding, -bruising  Musculoskeletal: -joint aches, -muscle aches, -joint swelling, -back pain, -neck pain, -cramping, -changes in gait Ophthalmology: denies vision changes, eye redness, itching, discharge Urology: -burning with urination, -difficulty urinating, -blood in urine, -urinary frequency, -urgency, -incontinence Neurology: -headache, -weakness, -tingling, -numbness, -memory loss, -falls, -dizziness Psychology: -depressed mood, -agitation, -sleep problems     Objective:   Physical Exam  BP 120/80 mmHg  Pulse 80  Ht 6' 7.5" (2.019 m)  Wt 333 lb (151.048 kg)  BMI 37.05 kg/m2  General appearance: alert, no distress, WD/WN, white male Skin:scattered macules, no worrisome lesions HEENT: normocephalic, conjunctiva/corneas normal, sclerae anicteric, PERRLA, EOMi, nares patent, no discharge or erythema, pharynx normal Oral cavity: MMM, tongue normal, teeth normal Neck: supple, no lymphadenopathy, no thyromegaly, no masses, normal ROM, no bruits Chest: non tender, normal shape and expansion Heart: RRR, normal S1, S2, no murmurs Lungs: CTA bilaterally, no wheezes, rhonchi, or rales Abdomen: +bs, soft, +striae, small 1cm umbilical hernia, bulge in upper central abdomen c/w diastasis recti vs hernia, otherwise non tender, non distended, no masses, no hepatomegaly, no splenomegaly, no bruits Back: non tender, normal ROM, no scoliosis Musculoskeletal: upper extremities non tender, no obvious deformity, normal ROM throughout, lower extremities non tender, no obvious deformity, normal ROM throughout Extremities: left leg with moderate varicosities just above knee and mid anterior thigh left, otherwise no edema, no cyanosis, no  clubbing Pulses: 2+ symmetric, upper and lower extremities, normal cap refill Neurological: alert, oriented x 3, CN2-12 intact, strength normal upper extremities and lower extremities, sensation normal throughout, DTRs 2+ throughout, no cerebellar signs, gait normal Psychiatric: normal affect, behavior normal, pleasant  GU: normal male external genitalia, circumcised, nontender, no masses, no hernia, no lymphadenopathy Rectal: anus with small external hemorrhoid left posterior area, normal tone, prostate mildly enlarged   Assessment and Plan :    Encounter Diagnoses  Name Primary?  . Encounter for health maintenance examination in adult Yes  . Obesity   . Gastroesophageal reflux disease without esophagitis   . Erectile dysfunction, unspecified erectile dysfunction type   . Hypogonadism in male   . Exposure to environmental hazard   . Varicose vein   . Chest heaviness   . Screen for STD (sexually transmitted disease)   . Diastasis recti    Physical exam - discussed healthy lifestyle, diet, exercise, preventative care, vaccinations, and addressed their concerns.   See your eye doctor yearly for routine vision care. See your dentist yearly for routine dental care including hygiene visits twice yearly. GERD - not causing much issues since he has been focusing on weight loss Obesity - c/t efforts with diet, exercise to lose weight ED -  c/t Cialis prn hypogonadism - labs today, c/t injections of TST Varicose veins - discussed possible referral to vein clinic Chest heaviness - likely GERD related given the alcohol use that day.  Advised recheck if he continues to have these symptoms. Screen for STD Diastasis recti - discussed findings, he wants to consider general surgery consult.  I recommended confirmation with imaging but he declines. Advised that if not a true hernia, general surgery may not do surgery. Follow-up pending labs

## 2016-03-26 LAB — PSA: PSA: 0.8 ng/mL (ref <=4.00)

## 2016-03-26 LAB — TESTOSTERONE: Testosterone: 630 ng/dL (ref 250–827)

## 2016-03-26 LAB — GC/CHLAMYDIA PROBE AMP
CT PROBE, AMP APTIMA: NOT DETECTED
GC Probe RNA: NOT DETECTED

## 2016-03-26 LAB — RPR

## 2016-03-27 ENCOUNTER — Other Ambulatory Visit: Payer: Self-pay | Admitting: Medical

## 2016-03-27 MED ORDER — TESTOSTERONE CYPIONATE 200 MG/ML IM SOLN: 300.0000 mg | Freq: Once | INTRAMUSCULAR | Status: DC

## 2016-03-27 NOTE — Addendum Note (Signed)
Addended by: Kieth BrightlyLAWSON, Evangaline Jou M on: 03/27/2016 09:34 AM   Modules accepted: Orders, SmartSet

## 2016-04-02 ENCOUNTER — Other Ambulatory Visit (INDEPENDENT_AMBULATORY_CARE_PROVIDER_SITE_OTHER): Payer: BLUE CROSS/BLUE SHIELD

## 2016-04-02 DIAGNOSIS — E291 Testicular hypofunction: Secondary | ICD-10-CM

## 2016-04-02 DIAGNOSIS — R7989 Other specified abnormal findings of blood chemistry: Secondary | ICD-10-CM

## 2016-04-02 MED ORDER — TESTOSTERONE CYPIONATE 200 MG/ML IM SOLN
300.0000 mg | Freq: Once | INTRAMUSCULAR | Status: AC
  Administered 2016-04-02: 300 mg via INTRAMUSCULAR

## 2016-04-17 ENCOUNTER — Other Ambulatory Visit (INDEPENDENT_AMBULATORY_CARE_PROVIDER_SITE_OTHER): Payer: BLUE CROSS/BLUE SHIELD

## 2016-04-17 DIAGNOSIS — E291 Testicular hypofunction: Secondary | ICD-10-CM | POA: Diagnosis not present

## 2016-04-17 DIAGNOSIS — R7989 Other specified abnormal findings of blood chemistry: Secondary | ICD-10-CM

## 2016-04-17 MED ORDER — TESTOSTERONE CYPIONATE & PROP 200-20 MG/ML IM SOLN
300.0000 mg | Freq: Once | INTRAMUSCULAR | Status: AC
  Administered 2016-04-17: 300 mg via INTRAMUSCULAR

## 2016-05-01 ENCOUNTER — Other Ambulatory Visit (INDEPENDENT_AMBULATORY_CARE_PROVIDER_SITE_OTHER): Payer: Self-pay

## 2016-05-01 DIAGNOSIS — E291 Testicular hypofunction: Secondary | ICD-10-CM

## 2016-05-07 ENCOUNTER — Other Ambulatory Visit: Payer: BLUE CROSS/BLUE SHIELD

## 2016-05-14 ENCOUNTER — Other Ambulatory Visit: Payer: BLUE CROSS/BLUE SHIELD | Admitting: *Deleted

## 2016-05-14 DIAGNOSIS — E291 Testicular hypofunction: Secondary | ICD-10-CM

## 2016-05-28 ENCOUNTER — Other Ambulatory Visit: Payer: Self-pay

## 2016-05-29 ENCOUNTER — Other Ambulatory Visit (INDEPENDENT_AMBULATORY_CARE_PROVIDER_SITE_OTHER): Payer: Self-pay

## 2016-05-29 DIAGNOSIS — E291 Testicular hypofunction: Secondary | ICD-10-CM

## 2016-05-29 MED ORDER — TESTOSTERONE CYPIONATE 200 MG/ML IM SOLN
300.0000 mg | Freq: Once | INTRAMUSCULAR | Status: AC
  Administered 2016-05-29: 300 mg via INTRAMUSCULAR

## 2016-06-14 NOTE — Progress Notes (Deleted)
Pt came in for testosterone injection

## 2016-06-17 ENCOUNTER — Other Ambulatory Visit (INDEPENDENT_AMBULATORY_CARE_PROVIDER_SITE_OTHER): Payer: Self-pay

## 2016-07-02 ENCOUNTER — Other Ambulatory Visit (INDEPENDENT_AMBULATORY_CARE_PROVIDER_SITE_OTHER): Payer: Self-pay

## 2016-07-02 DIAGNOSIS — R7989 Other specified abnormal findings of blood chemistry: Secondary | ICD-10-CM

## 2016-07-02 DIAGNOSIS — E291 Testicular hypofunction: Secondary | ICD-10-CM

## 2016-07-02 MED ORDER — TESTOSTERONE CYPIONATE 200 MG/ML IM SOLN
300.0000 mg | Freq: Once | INTRAMUSCULAR | Status: AC
  Administered 2016-07-02: 300 mg via INTRAMUSCULAR

## 2016-08-21 ENCOUNTER — Encounter (HOSPITAL_COMMUNITY): Payer: Self-pay | Admitting: Emergency Medicine

## 2016-08-21 ENCOUNTER — Ambulatory Visit (HOSPITAL_COMMUNITY)
Admission: AD | Admit: 2016-08-21 | Discharge: 2016-08-21 | Disposition: A | Discharge status: Home / Self Care | Payer: Self-pay | Attending: Family Medicine | Admitting: Family Medicine

## 2016-08-21 DIAGNOSIS — R590 Localized enlarged lymph nodes: Secondary | ICD-10-CM

## 2016-08-21 MED ORDER — DOXYCYCLINE HYCLATE 100 MG PO CAPS
100.0000 mg | ORAL_CAPSULE | Freq: Two times a day (BID) | ORAL | 0 refills | Status: AC
Start: 2016-08-21 — End: 2016-08-31

## 2016-08-21 NOTE — ED Triage Notes (Signed)
[facility] a complaint of a possible swollen lymph node under his right arm that started swelling 1 week ago.

## 2016-08-21 NOTE — ED Provider Notes (Signed)
CSN: [id number]     Arrival date & time 08/21/16  1107 History   First MD Initiated Contact with Patient 08/21/16 1257     Chief Complaint  Patient presents with  . Lymphadenopathy   (Consider location/radiation/quality/duration/timing/severity/associated sxs/prior Treatment) Mr. Alesia BandaRizzo is a well-appearing 45 y.o male, presents today for a swelling spot under his right axilla. Patient noticed the swelling 5 days ago and reports the swelling is getting bigger. He reports pain and tenderness over this swollen area. He have put heat over the area and have been taking Aleve for pain. He believes it is his lymph node but also thinks that it could be ingrown hair or abscess. He denies fever at home. He reports just recently recovered from a URI.       Past Medical History:  Diagnosis Date  . 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 GERD (gastroesophageal reflux disease)   . Joint pain   . Obesity   . Wears glasses    Past Surgical History:  Procedure Laterality Date  . ESOPHAGOGASTRODUODENOSCOPY     remote past  . NASAL SEPTUM SURGERY  1993   in college    Family History  Problem Relation Age of Onset  . Cancer Mother     pancreatic  . Diabetes Father   . Kidney disease Father   . Heart disease Father 7260  . Stroke Neg Hx   . COPD Neg Hx   . Asthma Neg Hx    Social History  Substance Use Topics  . Smoking status: Never Smoker  . Smokeless tobacco: Never Used  . Alcohol use 1.2 oz/week    1 Glasses of wine, 1 Shots of liquor per week    Review of Systems  All other systems reviewed and are negative.   Allergies  Review of patient's allergies indicates no known allergies.  Home Medications   Prior to Admission medications   Medication Sig Start Date End Date Taking? Authorizing Provider  Albuterol Sulfate (PROAIR RESPICLICK) 108 (90 BASE) MCG/ACT AEPB  Inhale 1 Inhaler into the lungs every 6 (six) hours as needed. Patient not taking: Reported on 04/27/2015 02/02/15   Kermit Baloavid S Tysinger, PA-C  doxycycline (VIBRAMYCIN) 100 MG capsule Take 1 capsule (100 mg total) by mouth 2 (two) times daily. 08/21/16 08/31/16  Lucia EstelleFeng Georgene Kopper, NP  Fish Oil-Krill Oil (KRILL OIL PLUS PO) Take by mouth. Reported on 03/25/2016    Historical Provider, MD  Misc Natural Products (OSTEO BI-FLEX ADV DOUBLE ST) CAPS Take by mouth.    Historical Provider, MD  mometasone [facility](ASMANEX) 220 MCG/INH inhaler Inhale 1 puff into the lungs 2 (two) times daily. Patient not taking: Reported on 06/27/2015 02/01/15   Kermit Baloavid S Tysinger, PA-C  tadalafil (CIALIS) 10 MG tablet 1/2-1 tablet po daily prn 03/25/16   Kermit Baloavid S Tysinger, PA-C  testosterone cypionate (DEPOTESTOSTERONE CYPIONATE) 200 MG/ML injection Inject 1.5 mLs (300 mg total) into the muscle once. 03/27/16   Jac Canavanavid S Tysinger, PA-C   Meds Ordered and Administered this Visit  Medications - No data to display  BP 135/85 (BP Location: Right Arm)   Pulse 77   Temp 98.6 F (37 C) (Oral)   Resp 18   SpO2 99%  No data found.   Physical Exam  Constitutional: He is oriented to person, place, and time. He appears well-developed and well-nourished.  Cardiovascular: Normal rate, regular rhythm and normal heart sounds.   Pulmonary/Chest: Effort normal and breath sounds normal. No respiratory distress. He has no wheezes.  Neurological: He is alert and oriented to person, place, and time.  Skin:  Has a swollen area to his right axilla that is tender on palpation. No redness noted. Has discrete erythematous papules noted on right upper anterior chest.   Nursing note and vitals reviewed.   Urgent Care Course   Clinical Course    Procedures (including critical care time)  Labs Review Labs Reviewed - No data to display  Imaging Review No results found.     MDM   1. Lymphadenopathy, axillary    Supervising physician consulted and in to see  patient. Believe the swelling is lymphadenopathy of unknown etiology; possibly from insect bites or from folliculitis as he noted to have erythematous papules at his right upper chest. Patient given doxycycline 100 mg BID x 10 days. Instructed to f/u with his PCP or return if he does not improve.     Lucia Estelle, NP 08/21/16 1320

## 2016-12-03 ENCOUNTER — Telehealth: Payer: Self-pay

## 2016-12-03 NOTE — Telephone Encounter (Signed)
pls call in refill with 2 refills

## 2016-12-03 NOTE — Telephone Encounter (Signed)
Needs refill of testosterone cyp 200mg /ml sent to [facility] per fax request.

## 2016-12-06 MED ORDER — TESTOSTERONE CYPIONATE 200 MG/ML IM SOLN
300.0000 mg | Freq: Once | INTRAMUSCULAR | 2 refills | Status: DC
Start: 2016-12-06 — End: 2017-05-13

## 2016-12-06 NOTE — Telephone Encounter (Signed)
Called to pharmacy for pt

## 2017-04-08 ENCOUNTER — Other Ambulatory Visit: Payer: Self-pay | Admitting: Medical

## 2017-04-09 NOTE — Telephone Encounter (Signed)
Called and lm for pt

## 2017-04-10 NOTE — Telephone Encounter (Signed)
Pt has an appt in June for cpe. Is this ok to refill?

## 2017-05-12 ENCOUNTER — Ambulatory Visit (INDEPENDENT_AMBULATORY_CARE_PROVIDER_SITE_OTHER): Payer: Self-pay | Admitting: Medical

## 2017-05-12 ENCOUNTER — Encounter: Payer: Self-pay | Admitting: Medical

## 2017-05-12 VITALS — BP 122/80 | HR 84 | Ht >= 80 in | Wt 335.6 lb

## 2017-05-12 DIAGNOSIS — K219 Gastro-esophageal reflux disease without esophagitis: Secondary | ICD-10-CM

## 2017-05-12 DIAGNOSIS — R7989 Other specified abnormal findings of blood chemistry: Secondary | ICD-10-CM

## 2017-05-12 DIAGNOSIS — Z0000 Encounter for general adult medical examination without abnormal findings: Secondary | ICD-10-CM

## 2017-05-12 DIAGNOSIS — M6208 Separation of muscle (nontraumatic), other site: Secondary | ICD-10-CM

## 2017-05-12 DIAGNOSIS — IMO0001 Reserved for inherently not codable concepts without codable children: Secondary | ICD-10-CM

## 2017-05-12 DIAGNOSIS — E291 Testicular hypofunction: Secondary | ICD-10-CM

## 2017-05-12 DIAGNOSIS — Z7185 Encounter for immunization safety counseling: Secondary | ICD-10-CM

## 2017-05-12 DIAGNOSIS — E669 Obesity, unspecified: Secondary | ICD-10-CM

## 2017-05-12 DIAGNOSIS — N529 Male erectile dysfunction, unspecified: Secondary | ICD-10-CM

## 2017-05-12 DIAGNOSIS — J4520 Mild intermittent asthma, uncomplicated: Secondary | ICD-10-CM

## 2017-05-12 DIAGNOSIS — Z7189 Other specified counseling: Secondary | ICD-10-CM

## 2017-05-12 LAB — CBC
HEMATOCRIT: 48.2 % (ref 38.5–50.0)
Hemoglobin: 16.3 g/dL (ref 13.2–17.1)
MCH: 29.7 pg (ref 27.0–33.0)
MCHC: 33.8 g/dL (ref 32.0–36.0)
MCV: 88 fL (ref 80.0–100.0)
MPV: 10.1 fL (ref 7.5–12.5)
Platelets: 210 10*3/uL (ref 140–400)
RBC: 5.48 MIL/uL (ref 4.20–5.80)
RDW: 14.5 % (ref 11.0–15.0)
WBC: 5.2 10*3/uL (ref 4.0–10.5)

## 2017-05-12 LAB — COMPREHENSIVE METABOLIC PANEL
ALT: 33 U/L (ref 9–46)
AST: 22 U/L (ref 10–40)
Albumin: 4.2 g/dL (ref 3.6–5.1)
Alkaline Phosphatase: 48 U/L (ref 40–115)
BILIRUBIN TOTAL: 0.7 mg/dL (ref 0.2–1.2)
BUN: 20 mg/dL (ref 7–25)
CALCIUM: 9.4 mg/dL (ref 8.6–10.3)
CO2: 21 mmol/L (ref 20–31)
Chloride: 101 mmol/L (ref 98–110)
Creat: 1.02 mg/dL (ref 0.60–1.35)
Glucose, Bld: 103 mg/dL — ABNORMAL HIGH (ref 65–99)
POTASSIUM: 4.2 mmol/L (ref 3.5–5.3)
Sodium: 135 mmol/L (ref 135–146)
Total Protein: 6.7 g/dL (ref 6.1–8.1)

## 2017-05-12 LAB — POCT URINALYSIS DIP (PROADVANTAGE DEVICE)
Bilirubin, UA: NEGATIVE
GLUCOSE UA: NEGATIVE mg/dL
Ketones, POC UA: NEGATIVE mg/dL
Leukocytes, UA: NEGATIVE
Nitrite, UA: NEGATIVE
PROTEIN UA: NEGATIVE mg/dL
RBC UA: NEGATIVE
SPECIFIC GRAVITY, URINE: NEGATIVE
UUROB: NEGATIVE
pH, UA: 6 (ref 5.0–8.0)

## 2017-05-12 LAB — LIPID PANEL
CHOL/HDL RATIO: 4.2 ratio (ref <5.0)
CHOLESTEROL: 137 mg/dL (ref <200)
HDL: 33 mg/dL — AB (ref >40)
LDL Cholesterol: 85 mg/dL (ref <100)
TRIGLYCERIDES: 96 mg/dL (ref <150)
VLDL: 19 mg/dL (ref <30)

## 2017-05-12 MED ORDER — TADALAFIL 5 MG PO TABS: ORAL_TABLET | ORAL | 11 refills | Status: AC

## 2017-05-12 MED ORDER — ALBUTEROL SULFATE 108 (90 BASE) MCG/ACT IN AEPB: 1.0000 | INHALATION_SPRAY | Freq: Four times a day (QID) | RESPIRATORY_TRACT | 1 refills | Status: AC | PRN

## 2017-05-12 NOTE — Progress Notes (Signed)
Subjective:   HPI  Alex Atkins is a 46 y.o. male who presents for physical, recheck on medications Chief Complaint  Patient presents with  . 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    Asthma - hasn't had to use albuterol much.  Hasn't felt SOB other than some wheezing a few weeks ago.  Nonsmoker.   Has gained some weight, eating more fast food in recent months after losing his job.  He is doing [employer], so eating fast food a lot.    Doing self testosterone injections, no particular c/o.  Reviewed their medical, surgical, family, social, medication, and allergy history and updated chart as appropriate.  Past Medical History:  Diagnosis Date  . Erectile dysfunction   . 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 GERD (gastroesophageal reflux disease)   . Hypogonadism in male   . Joint pain   . Low testosterone   . Obesity   . Wears glasses     Past Surgical History:  Procedure Laterality Date  . ESOPHAGOGASTRODUODENOSCOPY     remote past  . NASAL SEPTUM SURGERY  1993   in college     Social History   Social History  . Marital status: Divorced    Spouse name: N/A  . Number of children: N/A  . Years of education: N/A   Occupational History  . Not on file.   Social History Main Topics  . Smoking status: Never Smoker  . Smokeless tobacco: Never Used  . Alcohol use 1.2 oz/week    1 Glasses of wine, 1 Shots of liquor per week  . Drug use: No  . Sexual activity: Not on file   Other Topics Concern  . Not on file   Social History Narrative   [employer], lives alone.  Has 39 yo son.   He is from [place].  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  Was athletic up until 2014.    Family History   Problem Relation Age of Onset  . Cancer Mother        pancreatic  . Diabetes Father   . Kidney disease Father   . Heart disease Father 71       died of MI  . Stroke Neg Hx   . COPD Neg Hx   . Asthma Neg Hx      Current Outpatient Prescriptions:  .  cyanocobalamin 100 MCG tablet, Take 100 mcg by mouth daily., Disp: , Rfl:  .  Misc Natural Products (OSTEO BI-FLEX ADV DOUBLE ST) CAPS, Take by mouth., Disp: , Rfl:  .  Multiple Vitamins-Minerals (MULTIVITAMIN WITH MINERALS) tablet, Take 1 tablet by mouth daily., Disp: , Rfl:  .  tadalafil (CIALIS) 5 MG tablet, TAKE 1 TO 2 TABLETS BY MOUTH ONCE A DAY IF NEEDED, Disp: 5 tablet, Rfl: 11 .  Albuterol Sulfate (PROAIR RESPICLICK) 108 (90 Base) MCG/ACT AEPB, Inhale 1 Inhaler into the lungs every 6 (six) hours as needed., Disp: 1 each, Rfl: 1 .  Fish Oil-Krill Oil (KRILL OIL PLUS PO), Take by mouth. Reported on 03/25/2016, Disp: , Rfl:  .  testosterone cypionate (DEPOTESTOSTERONE CYPIONATE) 200 MG/ML injection, Inject 1.5 mLs (300  mg total) into the muscle once., Disp: 10 mL, Rfl: 2  Current Facility-Administered Medications:  .  testosterone cypionate (DEPOTESTOSTERONE CYPIONATE) injection 300 mg, 300 mg, Intramuscular, Q14 Days, Ronnald NianLalonde, John C, MD, 300 mg at 05/01/16 1555 .  testosterone cypionate (DEPOTESTOSTERONE CYPIONATE) injection 300 mg, 300 mg, Intramuscular, Q14 Days, Tysinger, Kermit BaloDavid S, PA-C, 300 mg at 05/14/16 1544 .  testosterone cypionate (DEPOTESTOTERONE CYPIONATE) injection 300 mg, 300 mg, Intramuscular, Q14 Days, Tysinger, Kermit BaloDavid S, PA-C, 300 mg at 01/23/16 1509  No Known Allergies   Review of Systems Constitutional: -fever, -chills, -sweats, -unexpected weight change, -decreased appetite, -fatigue Allergy: -sneezing, -itching, -congestion Dermatology: -changing moles, --rash, -lumps ENT: -runny nose, -ear pain, -sore throat, -hoarseness, -sinus pain, -teeth pain, - ringing in ears, -hearing loss, -nosebleeds Cardiology: -chest  pain, -palpitations, -swelling, -difficulty breathing when lying flat, -waking up short of breath Respiratory: -cough, -shortness of breath, -difficulty breathing with exercise or exertion, -wheezing, -coughing up blood Gastroenterology: -abdominal pain, -nausea, -vomiting, -diarrhea, -constipation, -blood in stool, -changes in bowel movement, -difficulty swallowing or eating Hematology: -bleeding, -bruising  Musculoskeletal: -joint aches, -muscle aches, -joint swelling, -back pain, -neck pain, -cramping, -changes in gait Ophthalmology: denies vision changes, eye redness, itching, discharge Urology: -burning with urination, -difficulty urinating, -blood in urine, -urinary frequency, -urgency, -incontinence Neurology: -headache, -weakness, -tingling, -numbness, -memory loss, -falls, -dizziness Psychology: -depressed mood, -agitation, -sleep problems     Objective:   BP 122/80   Pulse 84   Ht 6\' 10"  (2.083 m)   Wt (!) 335 lb 9.6 oz (152.2 kg)   SpO2 96%   BMI 35.09 kg/m   General appearance: alert, no distress, WD/WN, Caucasian male Skin: scattered macules, no worrisome lesions HEENT: normocephalic, conjunctiva/corneas normal, sclerae anicteric, PERRLA, EOMi, nares patent, no discharge or erythema, pharynx normal Oral cavity: MMM, tongue normal, teeth - left upper molar with decay, otherwise teeth in good repair Neck: supple, no lymphadenopathy, no thyromegaly, no masses, normal ROM, no bruits Chest: non tender, normal shape and expansion Heart: RRR, normal S1, S2, no murmurs Lungs: CTA bilaterally, no wheezes, rhonchi, or rales Abdomen: +bs, soft, diathesis recti, otherwise non tender, non distended, no masses, no hepatomegaly, no splenomegaly, no bruits Back: non tender, normal ROM, no scoliosis Musculoskeletal: upper extremities non tender, no obvious deformity, normal ROM throughout, lower extremities non tender, no obvious deformity, normal ROM throughout Extremities: no edema, no  cyanosis, no clubbing Pulses: 2+ symmetric, upper and lower extremities, normal cap refill Neurological: alert, oriented x 3, CN2-12 intact, strength normal upper extremities and lower extremities, sensation normal throughout, DTRs 2+ throughout, no cerebellar signs, gait normal Psychiatric: normal affect, behavior normal, pleasant  GU: normal male external genitalia, circumcised, nontender, no masses, no hernia, no lymphadenopathy Rectal: declined   Assessment and Plan :    Encounter Diagnoses  Name Primary?  . Routine general medical examination at a health care facility Yes  . Vaccine counseling   . Hypogonadism in male   . Diastasis recti   . Erectile dysfunction, unspecified erectile dysfunction type   . Class 2 obesity with serious comorbidity in adult, unspecified BMI, unspecified obesity type   . Gastroesophageal reflux disease without esophagitis   . Low testosterone   . Mild intermittent asthma, unspecified whether complicated     Physical exam - discussed and counseled on healthy lifestyle, diet, exercise, preventative care, vaccinations, sick and well care, proper use of emergency dept and after hours care, and addressed their concerns.    Health screening: See your eye  doctor yearly for routine vision care. See your dentist yearly for routine dental care including hygiene visits twice yearly.  Declines STD testing today.  Counseled on safe sex  Cancer screening Discussed colonoscopy screening age 19yo unless higher risk for earlier screening Discussed PSA, prostate exam, and prostate cancer screening risks/benefits.   Discussed prostate symptoms as well.   Vaccinations: Counseled on the following vaccines:  influenza and Td.  He declines Td today  Acute issues discussed: none  Separate significant chronic issues discussed: Low T, hypogonadism - labs today, c/t home self injections pending lab results Asthma - no recent problems.  C/t albuterol prn.  ED -  doing fine on medication.  Alex Atkins was seen today for annual exam.  Diagnoses and all orders for this visit:  Routine general medical examination at a health care facility -     POCT Urinalysis DIP (Proadvantage Device) -     CBC -     Comprehensive metabolic panel -     Hemoglobin A1c -     Lipid panel -     PSA -     Testosterone  Vaccine counseling  Hypogonadism in male -     CBC -     Comprehensive metabolic panel -     PSA -     Testosterone  Diastasis recti  Erectile dysfunction, unspecified erectile dysfunction type  Class 2 obesity with serious comorbidity in adult, unspecified BMI, unspecified obesity type  Gastroesophageal reflux disease without esophagitis  Low testosterone -     PSA -     Testosterone  Mild intermittent asthma, unspecified whether complicated  Other orders -     Albuterol Sulfate (PROAIR RESPICLICK) 108 (90 Base) MCG/ACT AEPB; Inhale 1 Inhaler into the lungs every 6 (six) hours as needed. -     tadalafil (CIALIS) 5 MG tablet; TAKE 1 TO 2 TABLETS BY MOUTH ONCE A DAY IF NEEDED  Follow-up pending labs, yearly for physical

## 2017-05-13 ENCOUNTER — Other Ambulatory Visit: Payer: Self-pay | Admitting: Medical

## 2017-05-13 LAB — PSA: PSA: 0.8 ng/mL (ref <=4.0)

## 2017-05-13 LAB — HEMOGLOBIN A1C
Hgb A1c MFr Bld: 5.8 % — ABNORMAL HIGH (ref <5.7)
Mean Plasma Glucose: 120 mg/dL

## 2017-05-13 LAB — TESTOSTERONE: Testosterone: 109 ng/dL — ABNORMAL LOW (ref 250–827)

## 2017-05-13 MED ORDER — TESTOSTERONE CYPIONATE 200 MG/ML IM SOLN: INTRAMUSCULAR | 5 refills | Status: DC

## 2017-07-10 ENCOUNTER — Other Ambulatory Visit: Payer: Self-pay | Admitting: Family Medicine

## 2017-07-10 NOTE — Telephone Encounter (Signed)
Pt was seen for cpe with Alex Atkins. Is this okay to refill? Alex Atkins changed pt to every 3-4 weeks for injections instead of every 2 weeks

## 2017-07-11 NOTE — Telephone Encounter (Signed)
Called in med to pharmacy  

## 2017-07-11 NOTE — Telephone Encounter (Signed)
Okay 

## 2017-11-24 ENCOUNTER — Other Ambulatory Visit: Payer: Self-pay | Admitting: Family Medicine

## 2017-11-26 NOTE — Telephone Encounter (Signed)
Decline.  I thought he no showed or was on schedule recently?  He is due for f/u

## 2017-12-08 ENCOUNTER — Encounter (HOSPITAL_COMMUNITY): Payer: Self-pay | Admitting: Emergency Medicine

## 2017-12-08 ENCOUNTER — Ambulatory Visit (HOSPITAL_COMMUNITY)
Admission: EM | Admit: 2017-12-08 | Discharge: 2017-12-08 | Disposition: A | Discharge status: Home / Self Care | Payer: Self-pay | Attending: Family Medicine | Admitting: Family Medicine

## 2017-12-08 ENCOUNTER — Other Ambulatory Visit: Payer: Self-pay

## 2017-12-08 DIAGNOSIS — R21 Rash and other nonspecific skin eruption: Secondary | ICD-10-CM

## 2017-12-08 MED ORDER — DOXYCYCLINE HYCLATE 100 MG PO CAPS: 100.0000 mg | ORAL_CAPSULE | Freq: Two times a day (BID) | ORAL | 0 refills | Status: AC

## 2017-12-08 NOTE — ED Provider Notes (Signed)
[facility]    CSN: [id number] Arrival date & time: 12/08/17  1953     History   Chief Complaint Chief Complaint  Patient presents with  . Rash    HPI Alex Atkins is a 47 y.o. male presenting with rash to posterior neck. Area has been there for 1 week. States it popped up after he shaved his head with a razor. Since another red area has popped up on his right shoulder and back. He has also shaved his chest and back. Associated with pain, also itching from shirt touching lesions. Pain is more prominent than itching. Feels area increasing in size and pressure on his head. Denies fever.   HPI  Past Medical History:  Diagnosis Date  . Erectile dysfunction   . 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 GERD (gastroesophageal reflux disease)   . Hypogonadism in male   . Joint pain   . Low testosterone   . Obesity   . Wears glasses     Patient Active Problem List   Diagnosis Date Noted  . Vaccine counseling 05/12/2017  . Low testosterone 05/12/2017  . Mild intermittent asthma 05/12/2017  . Erectile dysfunction 03/25/2016  . Gastroesophageal reflux disease without esophagitis 03/25/2016  . Encounter for health maintenance examination in adult 03/25/2016  . Hypogonadism in male 03/25/2016  . Varicose vein 03/25/2016  . Routine general medical examination at a health care facility 03/25/2016  . Diastasis recti 03/25/2016  . Exposure to environmental hazard 01/19/2015  . Obesity 01/19/2015    Past Surgical History:  Procedure Laterality Date  . ESOPHAGOGASTRODUODENOSCOPY     remote past  . NASAL SEPTUM SURGERY  1993   in college        Home Medications    Prior to Admission medications   Medication Sig Start Date End Date Taking? Authorizing Provider  cyanocobalamin 100 MCG tablet Take 100 mcg by mouth daily.   Yes [provider]    Fish Oil-Krill Oil (KRILL OIL PLUS PO) Take by mouth. Reported on 03/25/2016   Yes [provider]  Misc Natural Products (OSTEO BI-FLEX ADV DOUBLE ST) CAPS Take by mouth.   Yes [provider]  Multiple Vitamins-Minerals (MULTIVITAMIN WITH MINERALS) tablet Take 1 tablet by mouth daily.   Yes [provider]  tadalafil (CIALIS) 5 MG tablet TAKE 1 TO 2 TABLETS BY MOUTH ONCE A DAY IF NEEDED 05/12/17  Yes Tysinger, Kermit Balo, PA-C  testosterone cypionate (DEPOTESTOSTERONE CYPIONATE) 200 MG/ML injection INJECT 1.5ML INTO THE MUSCLE EVERY 14 DAYS AS DIRECTED  11/26/17  Yes Tysinger, Kermit Balo, PA-C  Albuterol Sulfate (PROAIR RESPICLICK) 108 (90 Base) MCG/ACT AEPB Inhale 1 Inhaler into the lungs every 6 (six) hours as needed. 05/12/17   Tysinger, Kermit Balo, PA-C  doxycycline (VIBRAMYCIN) 100 MG capsule Take 1 capsule (100 mg total) by mouth 2 (two) times daily for 10 days. 12/08/17 12/18/17  Keynan Heffern, Junius Creamer, PA-C    Family History Family History  Problem Relation Age of Onset  . Cancer Mother        pancreatic  . Diabetes Father   . Kidney disease Father   . Heart disease Father 56       died of MI  . Stroke Neg Hx   . COPD Neg Hx   . Asthma Neg Hx  Social History Social History   Tobacco Use  . Smoking status: Never Smoker  . Smokeless tobacco: Never Used  Substance Use Topics  . Alcohol use: Yes    Alcohol/week: 1.2 oz    Types: 1 Glasses of wine, 1 Shots of liquor per week  . Drug use: No     Allergies   Patient has no known allergies.   Review of Systems Review of Systems  Constitutional: Negative for fever.  Respiratory: Negative for shortness of breath.   Cardiovascular: Negative for chest pain.  Gastrointestinal: Negative for abdominal pain, nausea and vomiting.  Musculoskeletal: Positive for neck pain.  Skin: Positive for rash.  Neurological: Negative for weakness.     Physical Exam Triage Vital Signs ED Triage Vitals  Enc Vitals Group      BP 12/08/17 2038 125/76     Pulse Rate 12/08/17 2038 90     Resp --      Temp 12/08/17 2038 98.2 F (36.8 C)     Temp Source 12/08/17 2038 Oral     SpO2 12/08/17 2038 98 %     Weight --      Height --      Head Circumference --      Peak Flow --      Pain Score 12/08/17 2036 6     Pain Loc --      Pain Edu? --      Excl. in GC? --    No data found.  Updated Vital Signs BP 125/76 (BP Location: Left Arm)   Pulse 90   Temp 98.2 F (36.8 C) (Oral)   SpO2 98%    Physical Exam  Constitutional: He is oriented to person, place, and time. He appears well-developed and well-nourished.  HENT:  Head: Normocephalic and atraumatic.  Eyes: Conjunctivae are normal.  Neck: Neck supple.  Cardiovascular: Normal rate.  Pulmonary/Chest: Effort normal. No respiratory distress.  Musculoskeletal: He exhibits no edema.  Neck full ROM  Neurological: He is alert and oriented to person, place, and time.  Skin: Skin is warm and dry. There is erythema.  Posterior Neck: 4 cm area of erythema with multiple papules, induration extending superiorly approximately 3 cm, does not feel deep  1cm area of erythema to superior right back  1 cm area of erythema to right superior chest  Psychiatric: He has a normal mood and affect.  Nursing note and vitals reviewed.           UC Treatments / Results  Labs (all labs ordered are listed, but only abnormal results are displayed) Labs Reviewed - No data to display  EKG  EKG Interpretation None       Radiology No results found.  Procedures Procedures (including critical care time)  Medications Ordered in UC Medications - No data to display   Initial Impression / Assessment and Plan / UC Course  I have reviewed the triage vital signs and the nursing notes.  Pertinent labs & imaging results that were available during my care of the patient were reviewed by me and considered in my medical decision making (see chart for details).      Given lesions appeared after shaving, and lesion on posterior neck has associated induration extending beyond lesion will treat as cellulitis vs. Abscess with doxycycline x 10 days. Warm compresses. May apply bandage to prevent shirt irritation. Follow up if symptoms not improving with antibiotic or spreading/worsening.   Final Clinical Impressions(s) / UC Diagnoses   Final  diagnoses:  Rash    ED Discharge Orders        Ordered    doxycycline (VIBRAMYCIN) 100 MG capsule  2 times daily     12/08/17 2106       Controlled Substance Prescriptions [place] Controlled Substance Registry consulted? Not Applicable   Lew Dawes, [place] 12/08/17 2131

## 2017-12-08 NOTE — ED Triage Notes (Signed)
Pt reports a rash on the back of his head that has spread to his right shoulder and his right chest.  Pt is concerned for MRSA.  He states he has swelling to the back of the head.

## 2017-12-08 NOTE — Discharge Instructions (Signed)
Take doxycycline twice daily for 10 days. Please apply warm compresses multiple times a day.   You may put neosporin and bandage to prevent irritation from shirt.   Please monitor areas for increased redness swelling, drainage. Please return if symptoms spreading after starting the antibiotic.

## 2018-03-31 ENCOUNTER — Encounter: Payer: Self-pay | Admitting: Medical

## 2018-05-25 ENCOUNTER — Other Ambulatory Visit: Payer: Self-pay | Admitting: Medical

## 2018-06-01 ENCOUNTER — Telehealth: Payer: Self-pay | Admitting: Medical

## 2018-06-01 NOTE — Telephone Encounter (Signed)
Pt called requesting refill on Testosterone. He is working to get documentation to show that he paid his account off so he can be reisstated as a pt but in the meantime pt wants to know is Vincenza HewsShane can refill Testosterone because pt said he can stop this med all at once. He would like a different type of testosterone however explained that usually require an appointment.

## 2018-06-02 ENCOUNTER — Other Ambulatory Visit: Payer: Self-pay | Admitting: Medical

## 2018-06-02 ENCOUNTER — Telehealth: Payer: Self-pay | Admitting: Family Medicine

## 2018-06-02 NOTE — Telephone Encounter (Signed)
Is he still active as a patient or not?

## 2018-06-02 NOTE — Telephone Encounter (Signed)
Tammy documented all of that in the phone call she sent back

## 2018-06-03 ENCOUNTER — Other Ambulatory Visit: Payer: Self-pay | Admitting: Medical

## 2018-06-03 MED ORDER — TESTOSTERONE CYPIONATE 150 MG/ML IJ SOLN: 2.0000 mL | INTRAMUSCULAR | 0 refills | Status: DC

## 2018-06-03 NOTE — Telephone Encounter (Signed)
Med sent.

## 2018-06-09 ENCOUNTER — Telehealth: Payer: Self-pay

## 2018-06-09 NOTE — Telephone Encounter (Signed)
[facility] called and wanted to change to 200 mg/ml due to does not come in 150mg /ml.  I ok change to 200mg /ml

## 2018-06-23 ENCOUNTER — Telehealth: Payer: Self-pay | Admitting: Family Medicine

## 2018-06-23 NOTE — Telephone Encounter (Signed)
Pt states that [facility] is [facility] and [facility] will not have it until end of September. Pt requested that the refill be sent to [facility] [address] instead.

## 2018-06-24 MED ORDER — TESTOSTERONE CYPIONATE 150 MG/ML IJ SOLN: 2.0000 mL | INTRAMUSCULAR | 0 refills | Status: DC

## 2018-06-24 NOTE — Telephone Encounter (Signed)
Pt has called and requested his prescription sent on 06/03/18 for Testosterone be sent to [facility] because it;s on back order with [facility]. This information was confirmed with [facility] Pharmacist.

## 2018-06-24 NOTE — Telephone Encounter (Signed)
The prescription printed out. Can you please resend to [facility] the Testosterone.

## 2018-06-24 NOTE — Telephone Encounter (Signed)
lmom asking patient to call the office 

## 2018-06-24 NOTE — Telephone Encounter (Signed)
Spoke to patient. Will call the [facility] to inquire about the back order and cancel if need be

## 2018-06-26 ENCOUNTER — Other Ambulatory Visit: Payer: Self-pay | Admitting: Medical

## 2018-06-26 MED ORDER — TESTOSTERONE CYPIONATE 200 MG/ML IM SOLN: INTRAMUSCULAR | 0 refills | Status: AC

## 2018-11-24 ENCOUNTER — Other Ambulatory Visit: Payer: Self-pay | Admitting: Family Medicine

## 2018-12-09 ENCOUNTER — Other Ambulatory Visit: Payer: Self-pay | Admitting: Family Medicine

## 2023-05-18 NOTE — ED Notes (Signed)
 Radiology at bedside.    Ginny Richardson Breeding, RN 05/18/23 [phone]

## 2023-05-18 NOTE — ED Notes (Signed)
 Report given to Rosina, [place].   Ginny Richardson Breeding, RN 05/18/23 [phone]
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
## 2023-05-18 NOTE — ED Notes (Signed)
 Pt transported to CT via stretcher per this RN. Remains on monitor,    Ginny Richardson Breeding, RN 05/18/23 1731

## 2023-06-04 ENCOUNTER — Other Ambulatory Visit: Payer: Self-pay | Admitting: Orthopedic Surgery

## 2023-06-04 DIAGNOSIS — M25561 Pain in right knee: Secondary | ICD-10-CM

## 2023-06-04 DIAGNOSIS — M545 Low back pain, unspecified: Secondary | ICD-10-CM

## 2023-06-04 DIAGNOSIS — M25562 Pain in left knee: Secondary | ICD-10-CM

## 2023-06-15 ENCOUNTER — Other Ambulatory Visit: Payer: BLUE CROSS/BLUE SHIELD

## 2023-06-24 ENCOUNTER — Ambulatory Visit
Admission: RE | Admit: 2023-06-24 | Discharge: 2023-06-24 | Disposition: A | Discharge status: Home / Self Care | Payer: No Typology Code available for payment source | Source: Ambulatory Visit | Attending: Orthopedic Surgery | Admitting: Orthopedic Surgery

## 2023-06-24 DIAGNOSIS — M25561 Pain in right knee: Secondary | ICD-10-CM

## 2023-06-24 DIAGNOSIS — M25562 Pain in left knee: Secondary | ICD-10-CM

## 2023-06-24 DIAGNOSIS — M545 Low back pain, unspecified: Secondary | ICD-10-CM

## 2024-04-08 ENCOUNTER — Other Ambulatory Visit: Payer: Self-pay | Admitting: Surgery

## 2024-04-08 DIAGNOSIS — M545 Low back pain, unspecified: Secondary | ICD-10-CM

## 2024-04-08 DIAGNOSIS — M542 Cervicalgia: Secondary | ICD-10-CM

## 2024-04-24 ENCOUNTER — Other Ambulatory Visit

## 2024-07-10 ENCOUNTER — Ambulatory Visit (HOSPITAL_COMMUNITY)
Admission: EM | Admit: 2024-07-10 | Discharge: 2024-07-10 | Disposition: A | Discharge status: Home / Self Care | Attending: Physician Assistant | Admitting: Physician Assistant

## 2024-07-10 ENCOUNTER — Encounter (HOSPITAL_COMMUNITY): Payer: Self-pay

## 2024-07-10 DIAGNOSIS — I872 Venous insufficiency (chronic) (peripheral): Secondary | ICD-10-CM | POA: Insufficient documentation

## 2024-07-10 DIAGNOSIS — L97919 Non-pressure chronic ulcer of unspecified part of right lower leg with unspecified severity: Secondary | ICD-10-CM | POA: Diagnosis present

## 2024-07-10 DIAGNOSIS — L97929 Non-pressure chronic ulcer of unspecified part of left lower leg with unspecified severity: Secondary | ICD-10-CM | POA: Diagnosis present

## 2024-07-10 LAB — CBC
HCT: 45.6 % (ref 39.0–52.0)
Hemoglobin: 14.8 g/dL (ref 13.0–17.0)
MCH: 27.7 pg (ref 26.0–34.0)
MCHC: 32.5 g/dL (ref 30.0–36.0)
MCV: 85.4 fL (ref 80.0–100.0)
Platelets: 264 K/uL (ref 150–400)
RBC: 5.34 MIL/uL (ref 4.22–5.81)
RDW: 13.8 % (ref 11.5–15.5)
WBC: 7.8 K/uL (ref 4.0–10.5)
nRBC: 0 % (ref 0.0–0.2)

## 2024-07-10 LAB — BASIC METABOLIC PANEL WITH GFR
Anion gap: 8 (ref 5–15)
BUN: 19 mg/dL (ref 6–20)
CO2: 23 mmol/L (ref 22–32)
Calcium: 9.3 mg/dL (ref 8.9–10.3)
Chloride: 104 mmol/L (ref 98–111)
Creatinine, Ser: 1.15 mg/dL (ref 0.61–1.24)
GFR, Estimated: >60 mL/min (ref >60)
Glucose, Bld: 106 mg/dL — ABNORMAL HIGH (ref 70–99)
Potassium: 4 mmol/L (ref 3.5–5.1)
Sodium: 135 mmol/L (ref 135–145)

## 2024-07-10 MED ORDER — SILVER SULFADIAZINE 1 % EX CREA: 1.0000 | TOPICAL_CREAM | Freq: Two times a day (BID) | CUTANEOUS | 1 refills | Status: AC

## 2024-07-10 MED ORDER — CEPHALEXIN 500 MG PO CAPS
500.0000 mg | ORAL_CAPSULE | Freq: Two times a day (BID) | ORAL | 0 refills | Status: AC
Start: 2024-07-10 — End: 2024-07-20

## 2024-07-10 MED ORDER — SILVER SULFADIAZINE 1 % EX CREA: TOPICAL_CREAM | Freq: Once | CUTANEOUS | Status: AC

## 2024-07-10 MED ORDER — SILVER SULFADIAZINE 1 % EX CREA
TOPICAL_CREAM | CUTANEOUS | Status: AC
  Filled 2024-07-10: qty 85

## 2024-07-10 NOTE — Discharge Instructions (Addendum)
 Take antibiotic as prescribed Recommend Silvadene  twice daily and compression socks or ace bandages  If no improvement recommend follow up with wound care.

## 2024-07-10 NOTE — ED Provider Notes (Signed)
 [facility]    CSN: [id number] Arrival date & time: 07/10/24  1656      History   Chief Complaint Chief Complaint  Patient presents with   Wound Check    HPI Alex Atkins is a 53 y.o. male.   Patient presents with bilateral venous stasis wounds to lower extremities.  He reports he is a [employer] and is experiencing swelling to the lower extremities for about a year.  Wounds he became worse over the last few weeks.  He has been applying hydrocortisone cream and Neosporin and wrapping with compression wrap.  Denies chest pain shortness of breath.  He has not seen PCP in some time.  Requesting blood work today.    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   GERD (gastroesophageal reflux disease)    Hypogonadism in male    Joint pain    Low testosterone     Obesity    Wears glasses     Patient Active Problem List   Diagnosis Date Noted   Vaccine counseling 05/12/2017   Low testosterone  05/12/2017   Mild intermittent asthma 05/12/2017   Erectile dysfunction 03/25/2016   Gastroesophageal reflux disease without esophagitis 03/25/2016   Encounter for health maintenance examination in adult 03/25/2016   Hypogonadism in male 03/25/2016   Varicose vein 03/25/2016   Routine general medical examination at a health care facility 03/25/2016   Diastasis recti 03/25/2016   Exposure to environmental hazard 01/19/2015   Obesity 01/19/2015    Past Surgical History:  Procedure Laterality Date   ESOPHAGOGASTRODUODENOSCOPY     remote past   NASAL SEPTUM SURGERY  1993   in college        Home Medications    Prior to Admission medications   Medication Sig Start Date End Date Taking? Authorizing Provider  cephALEXin  (KEFLEX ) 500 MG capsule Take 1 capsule (500 mg total) by mouth 2 (two) times  daily for 10 days. 07/10/24 07/20/24 Yes Ward, Sole Lengacher Z, PA-C  silver  sulfADIAZINE  (SILVADENE ) 1 % cream Apply 1 Application topically 2 (two) times daily. 07/10/24  Yes Ward, Harlene PEDLAR, PA-C  Albuterol  Sulfate (PROAIR  RESPICLICK) 108 (90 Base) MCG/ACT AEPB Inhale 1 Inhaler into the lungs every 6 (six) hours as needed. Patient not taking: Reported on 07/10/2024 05/12/17   Tysinger, Alm RAMAN, PA-C  cyanocobalamin 100 MCG tablet Take 100 mcg by mouth daily.    [provider]  Fish Oil-Krill Oil (KRILL OIL PLUS PO) Take by mouth. Reported on 03/25/2016    [provider]  Misc Natural Products (OSTEO BI-FLEX ADV DOUBLE ST) CAPS Take by mouth.    [provider]  Multiple Vitamins-Minerals (MULTIVITAMIN WITH MINERALS) tablet Take 1 tablet by mouth daily.    [provider]  tadalafil  (CIALIS ) 5 MG tablet TAKE 1 TO 2 TABLETS BY MOUTH ONCE A DAY IF NEEDED 05/12/17   Tysinger, Alm RAMAN, PA-C  testosterone  cypionate (DEPOTESTOSTERONE CYPIONATE) 200 MG/ML injection INJECT 1.5ML INTO THE MUSCLE EVERY 14 DAYS AS DIRECTED 06/26/18   Tysinger, Alm RAMAN, PA-C    Family History Family History  Problem Relation Age of Onset   Cancer Mother        pancreatic   Diabetes Father    Kidney disease Father    Heart disease Father  60       died of MI   Stroke Neg Hx    COPD Neg Hx    Asthma Neg Hx     Social History Social History   Tobacco Use   Smoking status: Never   Smokeless tobacco: Never  Vaping Use   Vaping status: Never Used  Substance Use Topics   Alcohol use: Yes    Alcohol/week: 2.0 standard drinks of alcohol    Types: 1 Glasses of wine, 1 Shots of liquor per week   Drug use: No     Allergies   Patient has no known allergies.   Review of Systems Review of Systems  Constitutional:  Negative for chills and fever.  HENT:  Negative for ear pain and sore throat.   Eyes:  Negative for pain and visual disturbance.  Respiratory:  Negative for cough and shortness  of breath.   Cardiovascular:  Negative for chest pain and palpitations.  Gastrointestinal:  Negative for abdominal pain and vomiting.  Genitourinary:  Negative for dysuria and hematuria.  Musculoskeletal:  Negative for arthralgias and back pain.  Skin:  Positive for wound. Negative for color change and rash.  Neurological:  Negative for seizures and syncope.  All other systems reviewed and are negative.    Physical Exam Triage Vital Signs ED Triage Vitals [07/10/24 1743]  Encounter Vitals Group     BP 123/79     Girls Systolic BP Percentile      Girls Diastolic BP Percentile      Boys Systolic BP Percentile      Boys Diastolic BP Percentile      Pulse Rate 84     Resp 16     Temp 98 F (36.7 C)     Temp Source Oral     SpO2 95 %     Weight      Height      Head Circumference      Peak Flow      Pain Score 5     Pain Loc      Pain Education      Exclude from Growth Chart    No data found.  Updated Vital Signs BP 123/79 (BP Location: Right Arm)   Pulse 84   Temp 98 F (36.7 C) (Oral)   Resp 16   SpO2 95%   Visual Acuity Right Eye Distance:   Left Eye Distance:   Bilateral Distance:    Right Eye Near:   Left Eye Near:    Bilateral Near:     Physical Exam Vitals and nursing note reviewed.  Constitutional:      General: He is not in acute distress.    Appearance: He is well-developed.  HENT:     Head: Normocephalic and atraumatic.  Eyes:     Conjunctiva/sclera: Conjunctivae normal.  Cardiovascular:     Rate and Rhythm: Normal rate and regular rhythm.     Heart sounds: No murmur heard. Pulmonary:     Effort: Pulmonary effort is normal. No respiratory distress.     Breath sounds: Normal breath sounds.  Abdominal:     Palpations: Abdomen is soft.     Tenderness: There is no abdominal tenderness.  Musculoskeletal:        General: No swelling.     Cervical back: Neck supple.  Skin:    General: Skin is warm and dry.     Capillary Refill: Capillary  refill takes less than 2 seconds.  Comments: Venous stasis wounds to bilateral lower extremities.  Mild erythema surrounding.  Wounds are weeping.  Neurological:     Mental Status: He is alert.  Psychiatric:        Mood and Affect: Mood normal.      UC Treatments / Results  Labs (all labs ordered are listed, but only abnormal results are displayed) Labs Reviewed  CBC  BASIC METABOLIC PANEL WITH GFR    EKG   Radiology No results found.  Procedures Procedures (including critical care time)  Medications Ordered in UC Medications  silver  sulfADIAZINE  (SILVADENE ) 1 % cream ( Topical Given 07/10/24 1819)    Initial Impression / Assessment and Plan / UC Course  I have reviewed the triage vital signs and the nursing notes.  Pertinent labs & imaging results that were available during my care of the patient were reviewed by me and considered in my medical decision making (see chart for details).     Bilateral chronic venous stasis.  Antibiotic prescribed.  Advised to discontinue hydrocortisone and Neosporin will send in prescription for Silvadene  to apply twice a day and recommend compression socks or Ace bandages for compression.  If no improvement recommend follow-up with wound care. Final Clinical Impressions(s) / UC Diagnoses   Final diagnoses:  Venous stasis ulcer of both lower extremities without varicose veins (HCC)     Discharge Instructions      Take antibiotic as prescribed Recommend Silvadene  twice daily and compression socks or ace bandages  If no improvement recommend follow up with wound care.     ED Prescriptions     Medication Sig Dispense Auth. Provider   silver  sulfADIAZINE  (SILVADENE ) 1 % cream Apply 1 Application topically 2 (two) times daily. 50 g Ward, Dyquan Minks Z, PA-C   cephALEXin  (KEFLEX ) 500 MG capsule Take 1 capsule (500 mg total) by mouth 2 (two) times daily for 10 days. 20 capsule Ward, Addisen Chappelle Z, PA-C      PDMP not reviewed this  encounter.   Ward, Harlene PEDLAR, PA-C 07/10/24 [phone]

## 2024-07-10 NOTE — ED Triage Notes (Signed)
 Patient states he is a [employer] and has wounds to the bilateral lower extremities with hydrocolloid dressings. Patient states that he has been mixing his own remedies and packing his wounds. Patient states he has been taking Magnesium for his wounds and doing a low carb diet.

## 2024-07-14 ENCOUNTER — Ambulatory Visit: Payer: Self-pay
